# Patient Record
Sex: Female | Born: 1950 | Race: White | Hispanic: No | Marital: Married | Smoking: Never smoker
Health system: Southern US, Community
[De-identification: ages and names within clinical notes are randomized; demographics above are authoritative.]

## PROBLEM LIST (undated history)

## (undated) DIAGNOSIS — M810 Age-related osteoporosis without current pathological fracture: Secondary | ICD-10-CM

## (undated) DIAGNOSIS — R011 Cardiac murmur, unspecified: Secondary | ICD-10-CM

## (undated) DIAGNOSIS — C50919 Malignant neoplasm of unspecified site of unspecified female breast: Secondary | ICD-10-CM

## (undated) DIAGNOSIS — M1990 Unspecified osteoarthritis, unspecified site: Secondary | ICD-10-CM

## (undated) HISTORY — DX: Unspecified osteoarthritis, unspecified site: M19.90

## (undated) HISTORY — DX: Age-related osteoporosis without current pathological fracture: M81.0

## (undated) HISTORY — PX: BREAST RECONSTRUCTION: SHX9

## (undated) HISTORY — DX: Cardiac murmur, unspecified: R01.1

---

## 1987-10-06 DIAGNOSIS — C50919 Malignant neoplasm of unspecified site of unspecified female breast: Secondary | ICD-10-CM

## 1987-10-06 HISTORY — DX: Malignant neoplasm of unspecified site of unspecified female breast: C50.919

## 1987-10-06 HISTORY — PX: MASTECTOMY MODIFIED RADICAL: SUR848

## 1998-12-31 ENCOUNTER — Encounter: Payer: Self-pay | Admitting: Family Medicine

## 1998-12-31 ENCOUNTER — Ambulatory Visit (HOSPITAL_COMMUNITY): Admission: RE | Admit: 1998-12-31 | Discharge: 1998-12-31 | Payer: Self-pay | Admitting: Family Medicine

## 1999-01-21 ENCOUNTER — Encounter: Payer: Self-pay | Admitting: Plastic Surgery

## 1999-01-23 ENCOUNTER — Inpatient Hospital Stay (HOSPITAL_COMMUNITY): Admission: RE | Admit: 1999-01-23 | Discharge: 1999-01-26 | Payer: Self-pay | Admitting: Plastic Surgery

## 2003-02-27 ENCOUNTER — Other Ambulatory Visit: Admission: RE | Admit: 2003-02-27 | Discharge: 2003-02-27 | Payer: Self-pay | Admitting: Radiology

## 2007-06-13 ENCOUNTER — Other Ambulatory Visit: Admission: RE | Admit: 2007-06-13 | Discharge: 2007-06-13 | Payer: Self-pay | Admitting: Family Medicine

## 2009-07-03 ENCOUNTER — Encounter: Admission: RE | Admit: 2009-07-03 | Discharge: 2009-07-03 | Payer: Self-pay | Admitting: Radiology

## 2011-01-19 ENCOUNTER — Other Ambulatory Visit: Payer: Self-pay | Admitting: Family Medicine

## 2011-01-19 ENCOUNTER — Other Ambulatory Visit (HOSPITAL_COMMUNITY)
Admission: RE | Admit: 2011-01-19 | Discharge: 2011-01-19 | Disposition: A | Discharge status: Home / Self Care | Payer: PRIVATE HEALTH INSURANCE | Source: Ambulatory Visit | Attending: Family Medicine | Admitting: Family Medicine

## 2011-01-19 DIAGNOSIS — Z01419 Encounter for gynecological examination (general) (routine) without abnormal findings: Secondary | ICD-10-CM | POA: Insufficient documentation

## 2012-02-13 ENCOUNTER — Encounter (HOSPITAL_COMMUNITY): Payer: Self-pay | Admitting: *Deleted

## 2012-02-13 ENCOUNTER — Emergency Department (HOSPITAL_COMMUNITY)
Admission: EM | Admit: 2012-02-13 | Discharge: 2012-02-13 | Disposition: A | Discharge status: Home / Self Care | Payer: Managed Care, Other (non HMO) | Attending: Emergency Medicine | Admitting: Emergency Medicine

## 2012-02-13 ENCOUNTER — Emergency Department (HOSPITAL_COMMUNITY): Payer: Managed Care, Other (non HMO)

## 2012-02-13 DIAGNOSIS — S52502A Unspecified fracture of the lower end of left radius, initial encounter for closed fracture: Secondary | ICD-10-CM

## 2012-02-13 DIAGNOSIS — M25539 Pain in unspecified wrist: Secondary | ICD-10-CM | POA: Insufficient documentation

## 2012-02-13 DIAGNOSIS — M7989 Other specified soft tissue disorders: Secondary | ICD-10-CM | POA: Insufficient documentation

## 2012-02-13 DIAGNOSIS — W19XXXA Unspecified fall, initial encounter: Secondary | ICD-10-CM | POA: Insufficient documentation

## 2012-02-13 DIAGNOSIS — S52539A Colles' fracture of unspecified radius, initial encounter for closed fracture: Secondary | ICD-10-CM | POA: Insufficient documentation

## 2012-02-13 HISTORY — DX: Malignant neoplasm of unspecified site of unspecified female breast: C50.919

## 2012-02-13 MED ORDER — NAPROXEN 500 MG PO TABS: 500.0000 mg | ORAL_TABLET | Freq: Two times a day (BID) | ORAL | Status: AC

## 2012-02-13 MED ORDER — HYDROCODONE-ACETAMINOPHEN 5-500 MG PO TABS: 1.0000 | ORAL_TABLET | Freq: Four times a day (QID) | ORAL | Status: AC | PRN

## 2012-02-13 NOTE — ED Provider Notes (Signed)
History     CSN: [id number]  Arrival date & time 02/13/12  1901   First MD Initiated Contact with Patient 02/13/12 1908      No chief complaint on file.   (Consider location/radiation/quality/duration/timing/severity/associated sxs/prior treatment) Patient is a 61 y.o. female presenting with wrist pain. The history is provided by the patient.  Wrist Pain This is a new problem. The current episode started today. Associated symptoms include arthralgias and joint swelling. Pertinent negatives include no chills, fever, numbness or weakness.  Pt states she was walking and fell, putting her left hand down. States pain and swelling in left wrist. She went to her PCP, was sent here because their x-ray machine was not functioning. Suspected fracture was seen in distal radius. Pt denies weakness or numbness to the hand. No other injuries reported. No breaks through the skin  No past medical history on file.  No past surgical history on file.  No family history on file.  History  Substance Use Topics  . Smoking status: Not on file  . Smokeless tobacco: Not on file  . Alcohol Use: Not on file    OB History    No data available      Review of Systems  Constitutional: Negative for fever and chills.  Respiratory: Negative.   Musculoskeletal: Positive for joint swelling and arthralgias.  Skin: Negative.   Neurological: Negative for weakness and numbness.    Allergies  Codeine  Home Medications   Current Outpatient Rx  Name Route Sig Dispense Refill  . ASPIRIN EC 81 MG PO TBEC Oral Take 81 mg by mouth daily.    . ADULT MULTIVITAMIN W/MINERALS CH Oral Take 1 tablet by mouth daily.      There were no vitals taken for this visit.  Physical Exam  Nursing note and vitals reviewed. Constitutional: She is oriented to person, place, and time. She appears well-developed and well-nourished. No distress.  HENT:  Head: Normocephalic.  Eyes: Conjunctivae are normal.  Neck: Neck  supple.  Cardiovascular: Normal rate, regular rhythm and normal heart sounds.   Pulmonary/Chest: Effort normal and breath sounds normal. No respiratory distress. She has no wheezes.  Musculoskeletal: She exhibits edema and tenderness.       Swelling noted over left wrist. Tender over radial aspect of the wrist and distal radius. Pain with any wrist ROM. Normal hand and elbow exam.  Neurological: She is alert and oriented to person, place, and time.  Skin: Skin is warm and dry.  Psychiatric: She has a normal mood and affect.    ED Course  Procedures (including critical care time)  Labs Reviewed - No data to display Dg Wrist Complete Left  02/13/2012  *RADIOLOGY REPORT*  Clinical Data: Status post fall.  Wrist pain.  LEFT WRIST - COMPLETE 3+ VIEW  Comparison: None.  Findings: There is soft tissue swelling anterior to the distal radius.  There appears to be a nondisplaced fracture of the distal radius.  No other evidence of fracture is identified.  Degenerative change at the base of the thumb is noted.  IMPRESSION: Findings worrisome for nondisplaced distal radius fracture.  Original Report Authenticated By: Bernadene Bell. Maricela Curet, M.D.   Wrist placed in a splint. Will d/c home with pain medications and referral to a hand specialist.   1. Distal radius fracture, left       MDM          Lottie Mussel, PA 02/14/12 0132

## 2012-02-13 NOTE — ED Notes (Signed)
Pt missed a step, fell, catching self on L arm. Pt seen at [facility] dx w/ fracture, sent here for splinting.

## 2012-02-13 NOTE — Discharge Instructions (Signed)
Keep your splint on, avoid using left hand. Keep it elevated. Ice. Naprosyn for pain and inflammation. Take vicodin as prescribed as needed for severe pain. Follow up with Dr. Merlyn Lot in the office for recheck next week. Call to get an appointment.     Wrist Fracture Your caregiver has diagnosed you as having a fracture of the wrist. A fracture is a break in the bone or bones. A cast or splint is used to protect and keep your injured bone(s) from moving. The cast or splint will usually be on for about 5 to 6 weeks. One of the bones of the wrist (the navicular bone) often does not show up as a fracture on X-ray until later or in the healing phase. With this bone your caregiver will often cast as though it is fractured even if not seen on the X-ray. HOME CARE INSTRUCTIONS   To lessen the swelling, keep the injured part elevated while sitting or lying down. Keeping the injury above the level of your heart (the center of the chest) will decrease swelling and pain.   Do not wear rings or jewelry on the injured hand or wrist.   Apply ice to the injury for 15 to 20 minutes, 3 to 4 times per day while awake for 2 days. Put the ice in a plastic bag and place a thin towel between the bag of ice and your cast.   If you have a plaster or fiberglass cast:   Do not try to scratch the skin under the cast using sharp or pointed objects.   Check the skin around the cast every day. You may put lotion on any red or sore areas.   Keep your cast dry and clean.   If you have a plaster splint:   Wear the splint as directed.   You may loosen the elastic around the splint if your fingers become numb, tingle, or turn cold or blue.   If you have been put in a removable splint, wear and use as directed.   Do not use powders or deodorants in or around the cast or splint.   Do not remove padding from your cast or splint.   Do not put pressure on any part of your cast or splint. It may break. Rest your cast or  splint only on a pillow the first 24 hours until it is fully hardened.   Gently move your fingers often, so they do not get stiff.   Do not remove the splint unless directed by your caregiver. Casts must be removed by an orthopedist.   Your cast or splint can be protected during bathing with a plastic bag. Do not lower the cast or splint into water.   Only take over-the-counter or prescription medicines for pain, discomfort, or fever as directed by your caregiver.   Follow up with your caregiver as directed.  SEEK IMMEDIATE MEDICAL CARE IF:   Your cast or splint gets damaged or breaks.   Your cast or splint feels too tight or loose.   You have increased pain, not controlled with medication.   You have increased swelling.   Your skin or nails below the injury turn blue or grey or feel cold or numb.   You have trouble moving or feeling your fingers.   You experience any burning or stinging from the cast or splint.   There is a bad smell coming from under the cast or splint.   New stains or fluids are coming  from under the cast or splint.   You have any new injuries while wearing the cast or splint.  Document Released: 07/01/2005 Document Revised: 09/10/2011 Document Reviewed: 04/20/2007 [facility] Patient Information 2012 [place], [place].

## 2012-02-14 NOTE — ED Provider Notes (Signed)
Medical screening examination/treatment/procedure(s) were performed by non-physician practitioner and as supervising physician I was immediately available for consultation/collaboration. Marabelle Cushman, MD, FACEP   Penelope Fittro L Kaetlin Bullen, MD 02/14/12 1042 

## 2014-12-10 ENCOUNTER — Other Ambulatory Visit (INDEPENDENT_AMBULATORY_CARE_PROVIDER_SITE_OTHER): Payer: Self-pay | Admitting: Otolaryngology

## 2014-12-10 DIAGNOSIS — R221 Localized swelling, mass and lump, neck: Secondary | ICD-10-CM

## 2014-12-13 ENCOUNTER — Other Ambulatory Visit (HOSPITAL_COMMUNITY): Payer: Self-pay | Admitting: Diagnostic Radiology

## 2014-12-13 LAB — CREATININE, SERUM: CREATININE: 0.74 mg/dL (ref 0.50–1.10)

## 2014-12-13 LAB — BUN: BUN: 22 mg/dL (ref 6–23)

## 2014-12-14 ENCOUNTER — Other Ambulatory Visit: Payer: PRIVATE HEALTH INSURANCE

## 2014-12-14 ENCOUNTER — Ambulatory Visit
Admission: RE | Admit: 2014-12-14 | Discharge: 2014-12-14 | Disposition: A | Discharge status: Home / Self Care | Payer: PRIVATE HEALTH INSURANCE | Source: Ambulatory Visit | Attending: Otolaryngology | Admitting: Otolaryngology

## 2014-12-14 DIAGNOSIS — R221 Localized swelling, mass and lump, neck: Secondary | ICD-10-CM

## 2014-12-14 MED ORDER — IOPAMIDOL (ISOVUE-300) INJECTION 61%
75.0000 mL | Freq: Once | INTRAVENOUS | Status: AC | PRN
  Administered 2014-12-14: 75 mL via INTRAVENOUS

## 2015-10-01 DIAGNOSIS — Z853 Personal history of malignant neoplasm of breast: Secondary | ICD-10-CM | POA: Insufficient documentation

## 2015-10-01 DIAGNOSIS — M19042 Primary osteoarthritis, left hand: Secondary | ICD-10-CM

## 2015-10-01 DIAGNOSIS — M19041 Primary osteoarthritis, right hand: Secondary | ICD-10-CM | POA: Insufficient documentation

## 2015-10-16 DIAGNOSIS — Z23 Encounter for immunization: Secondary | ICD-10-CM | POA: Diagnosis not present

## 2015-10-22 DIAGNOSIS — M25761 Osteophyte, right knee: Secondary | ICD-10-CM | POA: Diagnosis not present

## 2015-11-06 DIAGNOSIS — Z1212 Encounter for screening for malignant neoplasm of rectum: Secondary | ICD-10-CM | POA: Diagnosis not present

## 2015-11-06 DIAGNOSIS — Z1211 Encounter for screening for malignant neoplasm of colon: Secondary | ICD-10-CM | POA: Diagnosis not present

## 2015-11-26 DIAGNOSIS — Z1211 Encounter for screening for malignant neoplasm of colon: Secondary | ICD-10-CM | POA: Insufficient documentation

## 2015-12-18 DIAGNOSIS — R6884 Jaw pain: Secondary | ICD-10-CM | POA: Diagnosis not present

## 2015-12-18 DIAGNOSIS — Z1211 Encounter for screening for malignant neoplasm of colon: Secondary | ICD-10-CM | POA: Diagnosis not present

## 2015-12-18 DIAGNOSIS — Z853 Personal history of malignant neoplasm of breast: Secondary | ICD-10-CM | POA: Diagnosis not present

## 2015-12-18 DIAGNOSIS — M19041 Primary osteoarthritis, right hand: Secondary | ICD-10-CM | POA: Diagnosis not present

## 2015-12-18 DIAGNOSIS — Z136 Encounter for screening for cardiovascular disorders: Secondary | ICD-10-CM | POA: Diagnosis not present

## 2015-12-18 DIAGNOSIS — M7051 Other bursitis of knee, right knee: Secondary | ICD-10-CM | POA: Diagnosis not present

## 2015-12-18 DIAGNOSIS — M19042 Primary osteoarthritis, left hand: Secondary | ICD-10-CM | POA: Diagnosis not present

## 2016-04-06 DIAGNOSIS — Z1231 Encounter for screening mammogram for malignant neoplasm of breast: Secondary | ICD-10-CM | POA: Diagnosis not present

## 2016-04-06 DIAGNOSIS — M810 Age-related osteoporosis without current pathological fracture: Secondary | ICD-10-CM | POA: Diagnosis not present

## 2016-04-06 DIAGNOSIS — Z853 Personal history of malignant neoplasm of breast: Secondary | ICD-10-CM | POA: Diagnosis not present

## 2016-04-20 LAB — HM DEXA SCAN

## 2016-04-22 DIAGNOSIS — M810 Age-related osteoporosis without current pathological fracture: Secondary | ICD-10-CM | POA: Insufficient documentation

## 2016-07-02 ENCOUNTER — Other Ambulatory Visit (HOSPITAL_COMMUNITY): Payer: Self-pay | Admitting: Orthopedic Surgery

## 2016-07-02 ENCOUNTER — Ambulatory Visit (HOSPITAL_COMMUNITY)
Admission: RE | Admit: 2016-07-02 | Discharge: 2016-07-02 | Disposition: A | Discharge status: Home / Self Care | Payer: Medicare Other | Source: Ambulatory Visit | Attending: Orthopedic Surgery | Admitting: Orthopedic Surgery

## 2016-07-02 DIAGNOSIS — M7989 Other specified soft tissue disorders: Secondary | ICD-10-CM | POA: Diagnosis not present

## 2016-07-02 DIAGNOSIS — M79662 Pain in left lower leg: Secondary | ICD-10-CM | POA: Insufficient documentation

## 2016-07-02 DIAGNOSIS — M79605 Pain in left leg: Secondary | ICD-10-CM

## 2016-07-02 DIAGNOSIS — M25562 Pain in left knee: Secondary | ICD-10-CM | POA: Diagnosis not present

## 2016-07-02 DIAGNOSIS — M1712 Unilateral primary osteoarthritis, left knee: Secondary | ICD-10-CM | POA: Diagnosis not present

## 2016-07-02 NOTE — Progress Notes (Signed)
*  PRELIMINARY RESULTS* Vascular Ultrasound Left lower extremity venous duplex has been completed.  Preliminary findings: No evidence of DVT or baker's cyst.   Called results to [facility].     Landry Mellow, RDMS, RVT   07/02/2016, 2:09 PM

## 2016-09-02 ENCOUNTER — Encounter (HOSPITAL_COMMUNITY): Payer: Self-pay | Admitting: Emergency Medicine

## 2016-09-02 ENCOUNTER — Emergency Department (HOSPITAL_COMMUNITY)
Admission: EM | Admit: 2016-09-02 | Discharge: 2016-09-02 | Disposition: A | Discharge status: Home / Self Care | Payer: Medicare Other | Attending: Emergency Medicine | Admitting: Emergency Medicine

## 2016-09-02 DIAGNOSIS — R040 Epistaxis: Secondary | ICD-10-CM | POA: Diagnosis not present

## 2016-09-02 DIAGNOSIS — Z853 Personal history of malignant neoplasm of breast: Secondary | ICD-10-CM | POA: Insufficient documentation

## 2016-09-02 DIAGNOSIS — Z7982 Long term (current) use of aspirin: Secondary | ICD-10-CM | POA: Diagnosis not present

## 2016-09-02 MED ORDER — OXYMETAZOLINE HCL 0.05 % NA SOLN
1.0000 | Freq: Once | NASAL | Status: AC
  Administered 2016-09-02: 1 via NASAL
  Filled 2016-09-02: qty 15

## 2016-09-02 NOTE — ED Notes (Signed)
Nose bleeding this is the 4th time this week. Has now stopped

## 2016-09-02 NOTE — ED Triage Notes (Addendum)
Patient reports daily nose bleeds x3 days. Patient reports 2 episodes today. Denies blood thinners and dizziness. Ambulatory to triage.

## 2016-09-05 NOTE — ED Provider Notes (Signed)
[facility] DEPT Provider Note   CSN: [id number] Arrival date & time: 09/02/16  1434     History   Chief Complaint Chief Complaint  Patient presents with  . Epistaxis    HPI Susan Beard is a 65 y.o. female.  HPI Patient presents to the emergency department reporting her fourth nosebleed this week.  She never previously had nosebleeds.  She's not on anticoagulants.  She feels like the blood is coming from the left neck.  No recent injury or trauma.  No recent illness.  She did recently move to a house with gas heat.  She was able to stop the bleeding with direct pressure   Past Medical History:  Diagnosis Date  . Breast cancer ([place])     There are no active problems to display for this patient.   Past Surgical History:  Procedure Laterality Date  . MASTECTOMY MODIFIED RADICAL      OB History    No data available       Home Medications    Prior to Admission medications   Medication Sig Start Date End Date Taking? Authorizing Provider  aspirin EC 81 MG tablet Take 81 mg by mouth daily.   Yes Historical Provider, MD  diphenhydrAMINE (BENADRYL) 50 MG tablet Take 50 mg by mouth at bedtime as needed for itching.   Yes Historical Provider, MD  Multiple Vitamin (MULITIVITAMIN WITH MINERALS) TABS Take 1 tablet by mouth daily.   Yes Historical Provider, MD    Family History History reviewed. No pertinent family history.  Social History Social History  Substance Use Topics  . Smoking status: Never Smoker  . Smokeless tobacco: Never Used  . Alcohol use No     Allergies   Codeine   Review of Systems Review of Systems  All other systems reviewed and are negative.    Physical Exam Updated Vital Signs BP 154/94 (BP Location: Left Arm)   Pulse 98   Temp 97.7 F (36.5 C)   Resp 18   Ht 5\' 9"  (1.753 m)   Wt 200 lb (90.7 kg)   SpO2 99%   BMI 29.53 kg/m   Physical Exam  Constitutional: She is oriented to person, place, and time. She appears  well-developed and well-nourished.  HENT:  Head: Normocephalic.  Posterior pharynx is normal.  Left neck demonstrates no active bleeding but stigmata of recent bleed.  No bleeding from the right neck.  Eyes: EOM are normal.  Neck: Normal range of motion.  Pulmonary/Chest: Effort normal.  Abdominal: She exhibits no distension.  Musculoskeletal: Normal range of motion.  Neurological: She is alert and oriented to person, place, and time.  Psychiatric: She has a normal mood and affect.  Nursing note and vitals reviewed.    ED Treatments / Results  Labs (all labs ordered are listed, but only abnormal results are displayed) Labs Reviewed - No data to display  EKG  EKG Interpretation None       Radiology No results found.  Procedures Procedures (including critical care time)  Medications Ordered in ED Medications  oxymetazoline (AFRIN) 0.05 % nasal spray 1 spray (1 spray Each Nare Given 09/02/16 1457)     Initial Impression / Assessment and Plan / ED Course  I have reviewed the triage vital signs and the nursing notes.  Pertinent labs & imaging results that were available during my care of the patient were reviewed by me and considered in my medical decision making (see chart for details).  Clinical Course  At time of my evaluation the patient is a complete resolution of epistaxis.  Her bleeding is controlled at this time.  Patient was discharged home with Afrin and hand and instructions on how to treat a nosebleed in the future.  She'll need outpatient ENT follow-up.  I recommended some humidified air in the house.  She understands return the emergency department for new or worsening symptoms  Final Clinical Impressions(s) / ED Diagnoses   Final diagnoses:  Epistaxis    New Prescriptions Discharge Medication List as of 09/02/2016  4:42 PM       Jola Schmidt, MD 09/05/16 [phone]

## 2016-12-18 DIAGNOSIS — M19041 Primary osteoarthritis, right hand: Secondary | ICD-10-CM | POA: Diagnosis not present

## 2016-12-18 DIAGNOSIS — M810 Age-related osteoporosis without current pathological fracture: Secondary | ICD-10-CM | POA: Diagnosis not present

## 2016-12-18 DIAGNOSIS — M19042 Primary osteoarthritis, left hand: Secondary | ICD-10-CM | POA: Diagnosis not present

## 2016-12-18 DIAGNOSIS — E782 Mixed hyperlipidemia: Secondary | ICD-10-CM | POA: Diagnosis not present

## 2016-12-18 DIAGNOSIS — Z0000 Encounter for general adult medical examination without abnormal findings: Secondary | ICD-10-CM | POA: Diagnosis not present

## 2016-12-18 DIAGNOSIS — Z23 Encounter for immunization: Secondary | ICD-10-CM | POA: Diagnosis not present

## 2016-12-18 DIAGNOSIS — R5383 Other fatigue: Secondary | ICD-10-CM | POA: Diagnosis not present

## 2017-04-08 DIAGNOSIS — Z1231 Encounter for screening mammogram for malignant neoplasm of breast: Secondary | ICD-10-CM | POA: Diagnosis not present

## 2017-04-08 DIAGNOSIS — Z853 Personal history of malignant neoplasm of breast: Secondary | ICD-10-CM | POA: Diagnosis not present

## 2017-04-08 LAB — HM MAMMOGRAPHY

## 2017-06-04 ENCOUNTER — Telehealth: Payer: Self-pay | Admitting: Rheumatology

## 2017-06-04 NOTE — Telephone Encounter (Signed)
Patient calling [facility] back with more information. Please call patient.

## 2017-06-04 NOTE — Telephone Encounter (Signed)
Patient called on behalf of her mom. See her profile

## 2017-11-18 ENCOUNTER — Ambulatory Visit: Payer: Medicare Other | Admitting: Family Medicine

## 2017-12-29 ENCOUNTER — Ambulatory Visit: Payer: Medicare Other | Admitting: Family Medicine

## 2018-01-17 ENCOUNTER — Ambulatory Visit: Payer: Medicare Other | Admitting: Family Medicine

## 2018-01-19 ENCOUNTER — Ambulatory Visit: Payer: Medicare Other | Admitting: Family Medicine

## 2018-01-20 ENCOUNTER — Telehealth: Payer: Self-pay | Admitting: Family Medicine

## 2018-01-20 ENCOUNTER — Other Ambulatory Visit: Payer: Self-pay | Admitting: Emergency Medicine

## 2018-01-20 ENCOUNTER — Encounter: Payer: Self-pay | Admitting: Family Medicine

## 2018-01-20 ENCOUNTER — Ambulatory Visit (INDEPENDENT_AMBULATORY_CARE_PROVIDER_SITE_OTHER): Payer: Medicare Other | Admitting: Family Medicine

## 2018-01-20 ENCOUNTER — Other Ambulatory Visit: Payer: Self-pay

## 2018-01-20 VITALS — BP 138/88 | HR 66 | Temp 97.7°F | Ht 68.5 in | Wt 209.6 lb

## 2018-01-20 DIAGNOSIS — M19042 Primary osteoarthritis, left hand: Secondary | ICD-10-CM | POA: Diagnosis not present

## 2018-01-20 DIAGNOSIS — Z1159 Encounter for screening for other viral diseases: Secondary | ICD-10-CM | POA: Diagnosis not present

## 2018-01-20 DIAGNOSIS — Z853 Personal history of malignant neoplasm of breast: Secondary | ICD-10-CM

## 2018-01-20 DIAGNOSIS — R8290 Unspecified abnormal findings in urine: Secondary | ICD-10-CM

## 2018-01-20 DIAGNOSIS — M810 Age-related osteoporosis without current pathological fracture: Secondary | ICD-10-CM | POA: Diagnosis not present

## 2018-01-20 DIAGNOSIS — Z0000 Encounter for general adult medical examination without abnormal findings: Secondary | ICD-10-CM | POA: Insufficient documentation

## 2018-01-20 DIAGNOSIS — E782 Mixed hyperlipidemia: Secondary | ICD-10-CM | POA: Diagnosis not present

## 2018-01-20 DIAGNOSIS — M19041 Primary osteoarthritis, right hand: Secondary | ICD-10-CM | POA: Diagnosis not present

## 2018-01-20 LAB — URINALYSIS, ROUTINE W REFLEX MICROSCOPIC
Bilirubin Urine: NEGATIVE
Hgb urine dipstick: NEGATIVE
Ketones, ur: NEGATIVE
Leukocytes, UA: NEGATIVE
Nitrite: NEGATIVE
PH: 8 (ref 5.0–8.0)
RBC / HPF: NONE SEEN (ref 0–?)
SPECIFIC GRAVITY, URINE: 1.01 (ref 1.000–1.030)
TOTAL PROTEIN, URINE-UPE24: NEGATIVE
Urine Glucose: NEGATIVE
Urobilinogen, UA: 0.2 (ref 0.0–1.0)

## 2018-01-20 LAB — CBC WITH DIFFERENTIAL/PLATELET
BASOS ABS: 0.1 10*3/uL (ref 0.0–0.1)
Basophils Relative: 1.2 % (ref 0.0–3.0)
EOS PCT: 2.2 % (ref 0.0–5.0)
Eosinophils Absolute: 0.1 10*3/uL (ref 0.0–0.7)
HCT: 39.6 % (ref 36.0–46.0)
Hemoglobin: 13.4 g/dL (ref 12.0–15.0)
Lymphocytes Relative: 39.7 % (ref 12.0–46.0)
Lymphs Abs: 2 10*3/uL (ref 0.7–4.0)
MCHC: 33.8 g/dL (ref 30.0–36.0)
MCV: 89.2 fl (ref 78.0–100.0)
Monocytes Absolute: 0.4 10*3/uL (ref 0.1–1.0)
Monocytes Relative: 8 % (ref 3.0–12.0)
Neutro Abs: 2.4 10*3/uL (ref 1.4–7.7)
Neutrophils Relative %: 48.9 % (ref 43.0–77.0)
Platelets: 198 10*3/uL (ref 150.0–400.0)
RBC: 4.44 Mil/uL (ref 3.87–5.11)
RDW: 14.4 % (ref 11.5–15.5)
WBC: 5 10*3/uL (ref 4.0–10.5)

## 2018-01-20 LAB — LIPID PANEL
CHOL/HDL RATIO: 4
Cholesterol: 263 mg/dL — ABNORMAL HIGH (ref 0–200)
HDL: 72.8 mg/dL (ref >39.00)
LDL CALC: 163 mg/dL — AB (ref 0–99)
NONHDL: 189.96
Triglycerides: 134 mg/dL (ref 0.0–149.0)
VLDL: 26.8 mg/dL (ref 0.0–40.0)

## 2018-01-20 LAB — COMPREHENSIVE METABOLIC PANEL
ALK PHOS: 64 U/L (ref 39–117)
ALT: 40 U/L — ABNORMAL HIGH (ref 0–35)
AST: 25 U/L (ref 0–37)
Albumin: 4.3 g/dL (ref 3.5–5.2)
BUN: 16 mg/dL (ref 6–23)
CO2: 27 mEq/L (ref 19–32)
Calcium: 9.2 mg/dL (ref 8.4–10.5)
Chloride: 104 mEq/L (ref 96–112)
Creatinine, Ser: 0.64 mg/dL (ref 0.40–1.20)
GFR: 98.03 mL/min (ref >60.00)
GLUCOSE: 94 mg/dL (ref 70–99)
POTASSIUM: 4.2 meq/L (ref 3.5–5.1)
Sodium: 140 mEq/L (ref 135–145)
TOTAL PROTEIN: 7.1 g/dL (ref 6.0–8.3)
Total Bilirubin: 0.5 mg/dL (ref 0.2–1.2)

## 2018-01-20 LAB — VITAMIN D 25 HYDROXY (VIT D DEFICIENCY, FRACTURES): VITD: 33.27 ng/mL (ref 30.00–100.00)

## 2018-01-20 MED ORDER — ALENDRONATE SODIUM 70 MG PO TABS: 70.0000 mg | ORAL_TABLET | ORAL | 3 refills | Status: DC

## 2018-01-20 MED ORDER — ZOSTER VAC RECOMB ADJUVANTED 50 MCG/0.5ML IM SUSR: 0.5000 mL | Freq: Once | INTRAMUSCULAR | 0 refills | Status: AC

## 2018-01-20 MED ORDER — ALENDRONATE SODIUM 70 MG PO TABS: ORAL_TABLET | ORAL | 3 refills | Status: DC

## 2018-01-20 NOTE — Telephone Encounter (Signed)
Changed sig; this is a weekly medication

## 2018-01-20 NOTE — Telephone Encounter (Signed)
Copied from [place] [id number]. Topic: Quick Communication - See Telephone Encounter >> Jan 20, 2018  9:51 AM Boyd Kerbs wrote: CRM for notification.   [facility] dosage (ususally is once a week, not daily)  alendronate (FOSAMAX) 70 MG tablet 90 tablet 3 01/20/2018   Sig: TAKE 1TAB IN THE MORNING WITH A FULL GLASS OF WATER ON AN EMPTY STOMACH. STAY UPRIGHT FOR 30 MINUTES  Sent to pharmacy as: alendronate (FOSAMAX) 70 MG tablet        See Telephone encounter for: 01/20/18.

## 2018-01-20 NOTE — Progress Notes (Signed)
Subjective  CC:  Chief Complaint  Patient presents with  . Establish Care    Transfer from [place], wants to discuss bloodwork from life insurance policy    HPI: Susan Beard is a 67 y.o. female who presents to [place] at [facility] today to reestablish care with me. She is a former [facility] patient and is here to reestablish care with me today.  Last medicare cpe with labs 12/2016 with AWV.   She has the following concerns or needs:  Osteoporosis on fosamax x 2 years. Tolerating well. No GERD or upper abdominal pain. Takes vit d and ca. Due for repeat dexa on meds. No falls or fractures.   OA hands and knee but not too bothersome.  Had urinalysis from insurance agency: see scan. Had + LE, Leuk, RBC and WBC but wasn't a clean catch. Denies all urinary sxs. No gross hematuria.  Lipid panel with LDL 160s. Had it controlled last year but up now. Denies change in diet. Would prefer to not be on statin unless necessary.   HM: due mammo and dexa in July; rec flu shot in September. shingrix now. Pt declines AWV. She denies concerns with memory, mood or falls.   We updated and reviewed the patient's past history in detail and it is documented below.  Patient Active Problem List   Diagnosis Date Noted  . Medicare annual wellness visit, subsequent 01/20/2018    Declined AWV 2019   . Age-related osteoporosis without current pathological fracture 04/22/2016    DEXA scan July 2017: Hip T equals -2.8, spine equals -2.5; discussed osteoporosis medications and started fosamax weekly   . Colon cancer screening 11/26/2015  . History of right breast cancer 10/01/2015    Modified radical mastectomy with reconstruction   . Primary osteoarthritis of both hands 10/01/2015   Health Maintenance  Topic Date Due  . Hepatitis C Screening  [birth year]  . DEXA SCAN  04/20/2018  . MAMMOGRAM  04/28/2018  . INFLUENZA VACCINE  05/05/2018  . TETANUS/TDAP  09/30/2018  . Fecal DNA  (Cologuard)  11/09/2018  . PNA vac Low Risk Adult  Completed   Immunization History  Administered Date(s) Administered  . Pneumococcal Conjugate-13 10/16/2015  . Pneumococcal Polysaccharide-23 12/18/2016  . Tdap 09/30/2008  . Zoster 09/30/2005   Current Meds  Medication Sig  . diphenhydrAMINE (BENADRYL) 50 MG tablet Take 50 mg by mouth at bedtime as needed for itching.  . diphenhydrAMINE (SOMINEX) 25 MG tablet Take by mouth.  . Multiple Vitamin (MULITIVITAMIN WITH MINERALS) TABS Take 1 tablet by mouth daily.  . [DISCONTINUED] alendronate (FOSAMAX) 70 MG tablet TAKE 1TAB IN THE MORNING WITH A FULL GLASS OF WATER ON AN EMPTY STOMACH. STAY UPRIGHT FOR 30 MINUTES    Allergies: Patient is allergic to codeine. Past Medical History Patient  has a past medical history of Age-related osteoporosis without current pathological fracture, Arthritis, and Breast cancer ([place]) (1989). Past Surgical History Patient  has a past surgical history that includes Mastectomy modified radical (Right, 1989) and Breast reconstruction (Right). Family History: Patient family history includes Arthritis in her mother; Brain cancer in her father; Diabetes in her daughter; Healthy in her sister and son; Hyperlipidemia in her father and mother; Osteoporosis in her mother; Polymyalgia rheumatica in her mother. Social History:  Patient  reports that she has never smoked. She has never used smokeless tobacco. She reports that she does not drink alcohol or use drugs.  Review of Systems: Constitutional: negative for fever  or malaise Ophthalmic: negative for photophobia, double vision or loss of vision Cardiovascular: negative for chest pain, dyspnea on exertion, or new LE swelling Respiratory: negative for SOB or persistent cough Gastrointestinal: negative for abdominal pain, change in bowel habits or melena Genitourinary: negative for dysuria or gross hematuria Musculoskeletal: negative for new gait disturbance or  muscular weakness Integumentary: negative for new or persistent rashes Neurological: negative for TIA or stroke symptoms Psychiatric: negative for SI or delusions Allergic/Immunologic: negative for hives  Patient Care Team    Relationship Specialty Notifications Start End  Leamon Arnt, MD PCP - General Family Medicine  01/20/18     Objective  Vitals: BP 138/88   Pulse 66   Temp 97.7 F (36.5 C)   Ht 5' 8.5" (1.74 m)   Wt 209 lb 9.6 oz (95.1 kg)   BMI 31.41 kg/m  General:  Well developed, well nourished, no acute distress  Psych:  Alert and oriented,normal mood and affect HEENT:  Normocephalic, atraumatic, non-icteric sclera, PERRL, oropharynx is without mass or exudate, supple neck without adenopathy, mass or thyromegaly Cardiovascular:  RRR without gallop, rub or murmur, nondisplaced PMI Respiratory:  Good breath sounds bilaterally, CTAB with normal respiratory effort Gastrointestinal: normal bowel sounds, soft, non-tender, no noted masses. No HSM MSK: no contusions. Joints are without erythema or swelling but OA changes in DIP bilateral hands w/o redness, warmth or ttp Skin:  Warm, no rashes or suspicious lesions noted Neurologic:    Mental status is normal. Gross motor and sensory exams are normal. Normal gait  Assessment  1. Age-related osteoporosis without current pathological fracture   2. History of right breast cancer   3. Primary osteoarthritis of both hands   4. Abnormal urinalysis   5. Mixed hyperlipidemia   6. Need for hepatitis C screening test      Plan   Osteoporosis: on Fosamax. Check vit D and Calcium levels. Ordered dexa for July.   OA - otc meds as needed.   Lipids- check fasting panel today and calculate ascvd risk score. Educated on statin indications today.   Recheck urine, clean catch today. Work up if abnormal. Check culture. She is asymptomatic  Check labs today. Declines awv. shingrix RX given.  Follow up:  Return in about 1 year (around  01/21/2019) for complete physical.  Commons side effects, risks, benefits, and alternatives for medications and treatment plan prescribed today were discussed, and the patient expressed understanding of the given instructions. Patient is instructed to call or message via MyChart if he/she has any questions or concerns regarding our treatment plan. No barriers to understanding were identified. We discussed Red Flag symptoms and signs in detail. Patient expressed understanding regarding what to do in case of urgent or emergency type symptoms.   Medication list was reconciled, printed and provided to the patient in AVS. Patient instructions and summary information was reviewed with the patient as documented in the AVS. This note was prepared with assistance of Dragon voice recognition software. Occasional wrong-word or sound-a-like substitutions may have occurred due to the inherent limitations of voice recognition software  Orders Placed This Encounter  Procedures  . Urine Culture  . HM MAMMOGRAPHY  . HM DEXA SCAN  . DG Bone Density  . Urinalysis, Routine w reflex microscopic  . Lipid panel  . CBC with Differential/Platelet  . Comprehensive metabolic panel  . VITAMIN D 25 Hydroxy (Vit-D Deficiency, Fractures)  . Hepatitis C antibody   Meds ordered this encounter  Medications  .  Zoster Vaccine Adjuvanted [facility]) injection    Sig: Inject 0.5 mLs into the muscle once for 1 dose. Please give 2nd dose 2-6 months after first dose    Dispense:  2 each    Refill:  0

## 2018-01-20 NOTE — Telephone Encounter (Signed)
Sig Changed and [facility].    Doloris Hall,  LPN

## 2018-01-20 NOTE — Patient Instructions (Signed)
Please return in 12 months for your annual complete physical; please come fasting.  We will call you with information regarding your referral appointment. [facility]ogram and bone density   If you have any questions or concerns, please don't hesitate to send me a message via MyChart or call the office at [phone]. Thank you for visiting with [place] today! It's our pleasure caring for you.  Please do these things to maintain good health!   Exercise at least 30-45 minutes a day,  4-5 days a week.   Eat a low-fat diet with lots of fruits and vegetables, up to 7-9 servings per day.  Drink plenty of water daily. Try to drink 8 8oz glasses per day.  Seatbelts can save your life. Always wear your seatbelt.  Place Smoke Detectors on every level of your home and check batteries every year.  Schedule an appointment with an eye doctor for an eye exam every 1-2 years  Safe sex - use condoms to protect yourself from STDs if you could be exposed to these types of infections. Use birth control if you do not want to become pregnant and are sexually active.  Avoid heavy alcohol use. If you drink, keep it to less than 2 drinks/day and not every day.  [place].  Choose someone you trust that could speak for you if you became unable to speak for yourself.  Depression is common in our stressful world.If you're feeling down or losing interest in things you normally enjoy, please come in for a visit.  If anyone is threatening or hurting you, please get help. Physical or Emotional Violence is never OK.   Calcium Intake Recommendations You can take Caltrate Plus twice a day or get it through your diet or other OTC supplements (Viactiv, OsCal etc)  Calcium is a mineral that affects many functions in the body, including:  Blood clotting.  Blood vessel function.  Nerve impulse conduction.  Hormone secretion.  Muscle contraction.  Bone and teeth functions.  Most of your body's  calcium supply is stored in your bones and teeth. When your calcium stores are low, you may be at risk for low bone mass, bone loss, and bone fractures. Consuming enough calcium helps to grow healthy bones and teeth and to prevent breakdown over time. It is very important that you get enough calcium if you are:  A child undergoing rapid growth.  An adolescent girl.  A pre- or post-menopausal woman.  A woman whose menstrual cycle has stopped due to anorexia nervosa or regular intense exercise.  An individual with lactose intolerance or a milk allergy.  A vegetarian.  What is my plan? Try to consume the recommended amount of calcium daily based on your age. Depending on your overall health, your health care provider may recommend increased calcium intake.General daily calcium intake recommendations by age are:  Birth to 6 months: 200 mg.  Infants 7 to 12 months: 260 mg.  Children 1 to 3 years: 700 mg.  Children 4 to 8 years: 1,000 mg.  Children 9 to 13 years: 1,300 mg.  Teens 14 to 18 years: 1,300 mg.  Adults 19 to 50 years: 1,000 mg.  Adult women 51 to 70 years: 1,200 mg.  Adult men 51 to 70 years: 1,000 mg.  Adults 71 years and older: 1,200 mg.  Pregnant and breastfeeding teens: 1,300 mg.  Pregnant and breastfeeding adults: 1,000 mg.  What do I need to know about calcium intake?  In order  for the body to absorb calcium, it needs vitamin D. You can get vitamin D through (we recommend getting [phone] units of Vitamin D daily) ? Direct exposure of the skin to sunlight. ? Foods, such as egg yolks, liver, saltwater fish, and fortified milk. ? Supplements.  Consuming too much calcium may cause: ? Constipation. ? Decreased absorption of iron and zinc. ? Kidney stones.  Calcium supplements may interact with certain medicines. Check with your health care provider before starting any calcium supplements.  Try to get most of your calcium from food. What foods can I  eat? Grains  Fortified oatmeal. Fortified ready-to-eat cereals. Fortified frozen waffles. Vegetables Turnip greens. Broccoli. Fruits Fortified orange juice. Meats and Other Protein Sources Canned sardines with bones. Canned salmon with bones. Soy beans. Tofu. Baked beans. Almonds. [place] nuts. Sunflower seeds. Dairy Milk. Yogurt. Cheese. Cottage cheese. Beverages Fortified soy milk. Fortified rice milk. Sweets/Desserts Pudding. Ice Cream. Milkshakes. Blackstrap molasses. The items listed above may not be a complete list of recommended foods or beverages. Contact your dietitian for more options. What foods can affect my calcium intake? It may be more difficult for your body to use calcium or calcium may leave your body more quickly if you consume large amounts of:  Sodium.  Protein.  Caffeine.  Alcohol.  This information is not intended to replace advice given to you by your health care provider. Make sure you discuss any questions you have with your health care provider. Document Released: 05/05/2004 Document Revised: 04/10/2016 Document Reviewed: 02/27/2014 Elsevier Interactive Patient Education  2018 [employer].

## 2018-01-20 NOTE — Telephone Encounter (Signed)
Is Fosmax Daily for this Patient or once a week? Please advise.   Doloris Hall,  LPN

## 2018-01-20 NOTE — Telephone Encounter (Signed)
Please clarify SIG for pharmacy.

## 2018-01-20 NOTE — Addendum Note (Signed)
Addended bySigurd Sos on: 01/20/2018 01:27 PM   Modules accepted: Orders

## 2018-01-22 LAB — HEPATITIS C ANTIBODY
Hepatitis C Ab: NONREACTIVE
SIGNAL TO CUT-OFF: 0.01 (ref <1.00)

## 2018-01-22 LAB — URINE CULTURE
MICRO NUMBER:: 90481057
SPECIMEN QUALITY:: ADEQUATE

## 2018-01-25 ENCOUNTER — Encounter: Payer: Self-pay | Admitting: Family Medicine

## 2018-01-25 MED ORDER — SIMVASTATIN 10 MG PO TABS: 10.0000 mg | ORAL_TABLET | Freq: Every day | ORAL | 3 refills | Status: DC

## 2018-02-08 ENCOUNTER — Other Ambulatory Visit: Payer: Self-pay

## 2018-02-08 ENCOUNTER — Telehealth: Payer: Self-pay

## 2018-02-08 DIAGNOSIS — R7989 Other specified abnormal findings of blood chemistry: Secondary | ICD-10-CM

## 2018-02-08 DIAGNOSIS — R945 Abnormal results of liver function studies: Principal | ICD-10-CM

## 2018-02-08 NOTE — Telephone Encounter (Signed)
Called patient and left a detailed voice message to see if the patient had started the Simvastatin and how she is tolerating this medication.  Dr. Jonni Sanger would like her to schedule a follow up for the medication and elevated LFTs in 3 months.   Labs have been ordered. Need to be fasting per Dr. Gerri Spore message.  CRM Created,

## 2018-03-22 ENCOUNTER — Encounter: Payer: Self-pay | Admitting: Emergency Medicine

## 2018-04-21 ENCOUNTER — Encounter: Payer: Self-pay | Admitting: Emergency Medicine

## 2018-04-21 DIAGNOSIS — Z1231 Encounter for screening mammogram for malignant neoplasm of breast: Secondary | ICD-10-CM | POA: Diagnosis not present

## 2018-04-21 DIAGNOSIS — M810 Age-related osteoporosis without current pathological fracture: Secondary | ICD-10-CM | POA: Diagnosis not present

## 2018-04-21 DIAGNOSIS — Z853 Personal history of malignant neoplasm of breast: Secondary | ICD-10-CM | POA: Diagnosis not present

## 2018-04-21 DIAGNOSIS — M8589 Other specified disorders of bone density and structure, multiple sites: Secondary | ICD-10-CM | POA: Diagnosis not present

## 2018-04-21 LAB — HM MAMMOGRAPHY

## 2018-04-21 LAB — HM DEXA SCAN

## 2018-04-22 ENCOUNTER — Encounter: Payer: Self-pay | Admitting: Emergency Medicine

## 2018-04-25 ENCOUNTER — Other Ambulatory Visit: Payer: Self-pay

## 2018-04-25 ENCOUNTER — Ambulatory Visit (INDEPENDENT_AMBULATORY_CARE_PROVIDER_SITE_OTHER): Payer: Medicare Other | Admitting: Family Medicine

## 2018-04-25 ENCOUNTER — Encounter: Payer: Self-pay | Admitting: Family Medicine

## 2018-04-25 VITALS — BP 130/78 | HR 88 | Temp 98.8°F | Ht 68.5 in | Wt 211.4 lb

## 2018-04-25 DIAGNOSIS — M810 Age-related osteoporosis without current pathological fracture: Secondary | ICD-10-CM

## 2018-04-25 DIAGNOSIS — E782 Mixed hyperlipidemia: Secondary | ICD-10-CM | POA: Diagnosis not present

## 2018-04-25 DIAGNOSIS — R945 Abnormal results of liver function studies: Secondary | ICD-10-CM | POA: Diagnosis not present

## 2018-04-25 DIAGNOSIS — M25562 Pain in left knee: Secondary | ICD-10-CM

## 2018-04-25 DIAGNOSIS — R7989 Other specified abnormal findings of blood chemistry: Secondary | ICD-10-CM

## 2018-04-25 LAB — HEPATIC FUNCTION PANEL
ALT: 24 U/L (ref 0–35)
AST: 16 U/L (ref 0–37)
Albumin: 4.2 g/dL (ref 3.5–5.2)
Alkaline Phosphatase: 58 U/L (ref 39–117)
BILIRUBIN TOTAL: 0.4 mg/dL (ref 0.2–1.2)
Bilirubin, Direct: 0.1 mg/dL (ref 0.0–0.3)
Total Protein: 7.1 g/dL (ref 6.0–8.3)

## 2018-04-25 LAB — LIPID PANEL
Cholesterol: 196 mg/dL (ref 0–200)
HDL: 67.8 mg/dL (ref >39.00)
LDL Cholesterol: 96 mg/dL (ref 0–99)
NONHDL: 128.31
Total CHOL/HDL Ratio: 3
Triglycerides: 162 mg/dL — ABNORMAL HIGH (ref 0.0–149.0)
VLDL: 32.4 mg/dL (ref 0.0–40.0)

## 2018-04-25 LAB — COMPREHENSIVE METABOLIC PANEL
ALK PHOS: 58 U/L (ref 39–117)
ALT: 24 U/L (ref 0–35)
AST: 16 U/L (ref 0–37)
Albumin: 4.2 g/dL (ref 3.5–5.2)
BUN: 15 mg/dL (ref 6–23)
CO2: 25 mEq/L (ref 19–32)
CREATININE: 0.74 mg/dL (ref 0.40–1.20)
Calcium: 9 mg/dL (ref 8.4–10.5)
Chloride: 106 mEq/L (ref 96–112)
GFR: 82.85 mL/min (ref >60.00)
GLUCOSE: 111 mg/dL — AB (ref 70–99)
Potassium: 3.8 mEq/L (ref 3.5–5.1)
SODIUM: 140 meq/L (ref 135–145)
TOTAL PROTEIN: 7.1 g/dL (ref 6.0–8.3)
Total Bilirubin: 0.4 mg/dL (ref 0.2–1.2)

## 2018-04-25 MED ORDER — DICLOFENAC SODIUM 75 MG PO TBEC: 75.0000 mg | DELAYED_RELEASE_TABLET | Freq: Two times a day (BID) | ORAL | 0 refills | Status: DC

## 2018-04-25 NOTE — Progress Notes (Signed)
Subjective  CC:  Chief Complaint  Patient presents with  . Hyperlipidemia    started simvastain on 02/08/2018  . Osteoporosis    Follow-up on bone density test, on Fosamax x2 years  . Knee Pain    Left, 4 to 6 weeks, intermittent, no injury    HPI: Susan Beard is a 67 y.o. female who presents to the office today to address the problems listed above in the chief complaint.  Follow-up hyperlipidemia: Start simvastatin 10 in April.  Tolerating well.  No myalgias.  Eating well.  Here fasting for recheck.  Had mildly elevated LFT on last check.  She had bone density scan this month.  Remains osteoporotic left femur although it is mildly improved with a T score of -2.6.  Other measures remained stable.  Tolerating Fosamax.  Normal calcium and vitamin D levels.  Complains of intermittent left knee pain with some swelling.  Had right knee pain several years back and diagnosed with mild osteoarthritis.  That resolved.  Now left knee pain with intermittent pain with walking, aching at night, clicking with walking.  No locking or giveaway.  Also dealing with left sciatica managed by chiropractor.  Denies groin pain.  Assessment  1. Mixed hyperlipidemia   2. Acute pain of left knee   3. Age-related osteoporosis without current pathological fracture   4. Elevated LFTs      Plan   Hyperlipidemia: Recheck on statin.  Refill needed.  Knee pain: Most consistent with osteoarthritis but could be meniscal injury as well.  Start ice, anti-inflammatory and knee brace as tolerated.  Follow-up if not improving for steroid injection and x-rays.  Discussed bone density scan and mildly improved osteoporosis.  Continue Fosamax for next 2 years, recheck.  Then consider drug holiday continue calcium and vitamin D  Recheck liver test.  On statin.  Follow up: Return in about 9 months (around 01/25/2019) for complete physical.   Orders Placed This Encounter  Procedures  . Comprehensive metabolic panel   . Lipid panel   Meds ordered this encounter  Medications  . diclofenac (VOLTAREN) 75 MG EC tablet    Sig: Take 1 tablet (75 mg total) by mouth 2 (two) times daily.    Dispense:  30 tablet    Refill:  0      I reviewed the patients updated PMH, FH, and SocHx.    Patient Active Problem List   Diagnosis Date Noted  . Mixed hyperlipidemia 04/25/2018  . Medicare annual wellness visit, subsequent 01/20/2018  . Age-related osteoporosis without current pathological fracture 04/22/2016  . Colon cancer screening 11/26/2015  . History of right breast cancer 10/01/2015  . Primary osteoarthritis of both hands 10/01/2015   Current Meds  Medication Sig  . alendronate (FOSAMAX) 70 MG tablet Take 1 tablet (70 mg total) by mouth once a week.  . diphenhydrAMINE (BENADRYL) 50 MG tablet Take 50 mg by mouth at bedtime as needed for itching.  . Multiple Vitamin (MULITIVITAMIN WITH MINERALS) TABS Take 1 tablet by mouth daily.  . simvastatin (ZOCOR) 10 MG tablet Take 1 tablet (10 mg total) by mouth at bedtime.  . [DISCONTINUED] diphenhydrAMINE (SOMINEX) 25 MG tablet Take by mouth.    Allergies: Patient is allergic to codeine. Family History: Patient family history includes Arthritis in her mother; Brain cancer in her father; Diabetes in her daughter; Healthy in her sister and son; Hyperlipidemia in her father and mother; Osteoporosis in her mother; Polymyalgia rheumatica in her mother. Social History:  Patient  reports that she has never smoked. She has never used smokeless tobacco. She reports that she does not drink alcohol or use drugs.  Review of Systems: Constitutional: Negative for fever malaise or anorexia Cardiovascular: negative for chest pain Respiratory: negative for SOB or persistent cough Gastrointestinal: negative for abdominal pain  Objective  Vitals: BP 130/78   Pulse 88   Temp 98.8 F (37.1 C)   Ht 5' 8.5" (1.74 m)   Wt 211 lb 6.4 oz (95.9 kg)   SpO2 98%   BMI 31.68  kg/m  General: no acute distress , A&Ox3 Left knee: Mild swelling, mild warmth, medial joint line tenderness negative Lachman negative McMurray's normal range of motion without crepitus    Commons side effects, risks, benefits, and alternatives for medications and treatment plan prescribed today were discussed, and the patient expressed understanding of the given instructions. Patient is instructed to call or message via MyChart if he/she has any questions or concerns regarding our treatment plan. No barriers to understanding were identified. We discussed Red Flag symptoms and signs in detail. Patient expressed understanding regarding what to do in case of urgent or emergency type symptoms.   Medication list was reconciled, printed and provided to the patient in AVS. Patient instructions and summary information was reviewed with the patient as documented in the AVS. This note was prepared with assistance of Dragon voice recognition software. Occasional wrong-word or sound-a-like substitutions may have occurred due to the inherent limitations of voice recognition software

## 2018-04-25 NOTE — Patient Instructions (Addendum)
Please return in April 2020 for your annual complete physical; please come fasting.  Return if your knee pain persists for further evaluation.  Start icing the knee and using the diclofenac twice a day.    If you have any questions or concerns, please don't hesitate to send me a message via MyChart or call the office at [phone]. Thank you for visiting with [place] today! It's our pleasure caring for you.   Knee Pain, Adult Knee pain in adults is common. It can be caused by many things, including:  Arthritis.  A fluid-filled sac (cyst) or growth in your knee.  An infection in your knee.  An injury that will not heal.  Damage, swelling, or irritation of the tissues that support your knee.  Knee pain is usually not a sign of a serious problem. The pain may go away on its own with time and rest. If it does not, a health care provider may order tests to find the cause of the pain. These may include:  Imaging tests, such as an X-ray, MRI, or ultrasound.  Joint aspiration. In this test, fluid is removed from the knee.  Arthroscopy. In this test, a lighted tube is inserted into knee and an image is projected onto a TV screen.  A biopsy. In this test, a sample of tissue is removed from the body and studied under a microscope.  Follow these instructions at home: Pay attention to any changes in your symptoms. Take these actions to relieve your pain. Activity  Rest your knee.  Do not do things that cause pain or make pain worse.  Avoid high-impact activities or exercises, such as running, jumping rope, or doing jumping jacks. General instructions  Take over-the-counter and prescription medicines only as told by your health care provider.  Raise (elevate) your knee above the level of your heart when you are sitting or lying down.  Sleep with a pillow under your knee.  If directed, apply ice to the knee: ? Put ice in a plastic bag. ? Place a towel between your skin and the  bag. ? Leave the ice on for 20 minutes, 2-3 times a day.  Ask your health care provider if you should wear an elastic knee support.  Lose weight if you are overweight. Extra weight can put pressure on your knee.  Do not use any products that contain nicotine or tobacco, such as cigarettes and e-cigarettes. Smoking may slow the healing of any bone and joint problems that you may have. If you need help quitting, ask your health care provider. Contact a health care provider if:  Your knee pain continues, changes, or gets worse.  You have a fever along with knee pain.  Your knee buckles or locks up.  Your knee swells, and the swelling becomes worse. Get help right away if:  Your knee feels warm to the touch.  You cannot move your knee.  You have severe pain in your knee.  You have chest pain.  You have trouble breathing. Summary  Knee pain in adults is common. It can be caused by many things, including, arthritis, infection, cysts, or injury.  Knee pain is usually not a sign of a serious problem, but if it does not go away, a health care provider may perform tests to know the cause of the pain.  Pay attention to any changes in your symptoms. Relieve your pain with rest, medicines, light activity, and use of ice.  Get help if your pain continues  or becomes very severe, or if your knee buckles or locks up, or if you have chest pain or trouble breathing. This information is not intended to replace advice given to you by your health care provider. Make sure you discuss any questions you have with your health care provider. Document Released: 07/19/2007 Document Revised: 09/11/2016 Document Reviewed: 09/11/2016 Elsevier Interactive Patient Education  [employer].

## 2018-06-14 ENCOUNTER — Ambulatory Visit: Payer: Self-pay

## 2018-06-14 ENCOUNTER — Encounter: Payer: Self-pay | Admitting: Sports Medicine

## 2018-06-14 ENCOUNTER — Ambulatory Visit (INDEPENDENT_AMBULATORY_CARE_PROVIDER_SITE_OTHER): Payer: Medicare Other | Admitting: Sports Medicine

## 2018-06-14 VITALS — BP 140/80 | HR 80 | Ht 68.5 in | Wt 211.4 lb

## 2018-06-14 DIAGNOSIS — M23204 Derangement of unspecified medial meniscus due to old tear or injury, left knee: Secondary | ICD-10-CM | POA: Diagnosis not present

## 2018-06-14 DIAGNOSIS — G8929 Other chronic pain: Secondary | ICD-10-CM | POA: Diagnosis not present

## 2018-06-14 DIAGNOSIS — M1712 Unilateral primary osteoarthritis, left knee: Secondary | ICD-10-CM

## 2018-06-14 DIAGNOSIS — M25562 Pain in left knee: Secondary | ICD-10-CM | POA: Diagnosis not present

## 2018-06-14 NOTE — Progress Notes (Signed)
Susan Beard. Susan Beard, [facility] - 67 y.o. female MRN [id number]  Date of birth: April 08, 1951  Visit Date: 06/14/2018  PCP: Leamon Arnt, MD   Referred by: Leamon Arnt, MD  Scribe(s) for today's visit: Wendy Poet, LAT, ATC  SUBJECTIVE:  Susan Beard is here for New Patient (Initial Visit) (L knee pain) .    Her L knee pain symptoms INITIALLY: Began a few months ago w/ known MOI.  She was prescribed Voltaren by her PCP which she took for a few weeks and found no relief w/ use of this medication. Described as mild-mod swollen/tight w/ intermittent sharp pain, nonradiating Worsened with walking and w/ rotation at night while she sleeps Improved with voltaren gel previously but it is no longer helping; ice Additional associated symptoms include: L knee swelling noted and intermittent clicking in the the L knee    At this time symptoms are worsening compared to onset  She took a short course of Voltaren but is no longer taking this.  Has also tried OTC Advil.   REVIEW OF SYSTEMS: Reports night time disturbances. Denies fevers, chills, or night sweats. Denies unexplained weight loss. Reports personal history of cancer. R breast CA in 1989. Denies changes in bowel or bladder habits. Denies recent unreported falls. Denies new or worsening dyspnea or wheezing. Denies headaches or dizziness.  Denies numbness, tingling or weakness  In the extremities.  Denies dizziness or presyncopal episodes Denies lower extremity edema    HISTORY & PERTINENT PRIOR DATA:  Prior history reviewed and updated per electronic medical record.  Social History   Occupational History  . Occupation: Licensed Animator: UNKNOWN  Tobacco Use  . Smoking status: Never Smoker  . Smokeless tobacco: Never Used  Substance and Sexual Activity  . Alcohol use: No  . Drug use: No  . Sexual activity: Yes     Birth control/protection: Post-menopausal   Social History   Social History Narrative  . Not on file    Past Medical History:  Diagnosis Date  . Age-related osteoporosis without current pathological fracture    Dexa Scan July 2017  . Arthritis   . Breast cancer [facility]) 1989   right   Past Surgical History:  Procedure Laterality Date  . BREAST RECONSTRUCTION Right   . MASTECTOMY MODIFIED RADICAL Right 1989   family history includes Arthritis in her mother; Brain cancer in her father; Diabetes in her daughter; Healthy in her sister and son; Hyperlipidemia in her father and mother; Osteoporosis in her mother; Polymyalgia rheumatica in her mother.   DATA OBTAINED & REVIEWED:  . No results for input(s): HGBA1C, LABURIC, CREATINE in the last 8760 hours. .   OBJECTIVE:  VS:  HT:5' 8.5" (174 cm)   WT:211 lb 6.4 oz (95.9 kg)  BMI:31.67    BP:140/80  HR:80bpm  TEMP: ( )  RESP:94 %   PHYSICAL EXAM: CONSTITUTIONAL: Well-developed, Well-nourished and In no acute distress Psychiatric: Alert & appropriately interactive. and Not depressed or anxious appearing. RESPIRATORY: No increased work of breathing and Trachea Midline EYES: Pupils are equal., EOM intact without nystagmus. and No scleral icterus.  Lower extremities: Warm and well perfused Pulses: DP Pulses: Bilaterally normal and symmetric PT Pulses: Bilaterally normal and symmetric Edema: Joint associated swelling as per Orthopedic exam and 1+ pitting edema pretibial bilaterally NEURO: unremarkable  MSK Exam:  Left Knee  Alignment &  Contours: normal Skin: No overlying erythema/ecchymosis Effusion: not significant   Generalized Synovitis: mild Knee Tenderness: Medial joint line, Lateral joint line and No focal bony TTP Gait: normal Patellar grind produces: No pain and No crepitation   RANGE OF MOTION & STRENGTH  EXTENSION: Normal  with no pain.   Strength: Normal FLEXION: Normal with no pain.   Strength: Normal     LIGAMENTOUS TESTING  Varus & Valgus Strain: stable to testing Anterior & Posterior Drawer: stable to testing    SPECIALITY TESTING:    Mcmurray's: positive, mild pain Thessaly: positive, mild pain    ASSESSMENT   1. Chronic pain of left knee   2. Primary osteoarthritis of left knee   3. Degenerative tear of medial meniscus of left knee     PLAN & PROCEDURES:  . Please see AVS (Patient Instructions) for further patient specific directions . US Guided Injection per procedure note . Discussed the foundation of treatment for this condition is physical therapy and/or daily (5-6 days/week) therapeutic exercises, focusing on core strengthening, coordination, neuromuscular control/reeducation.  Therapeutic exercises prescribed per procedure note. Marland Kitchen Rest the injured area as much as practical . Apply ice packs . If any lack of improvement consider further diagnostic evaluation with X-rays, repeat corticosteroid injections or Visco-supplementation n/a   Follow-up: Return in about 6 weeks (around 07/26/2018).      Please see additional documentation for Objective, Assessment and Plan sections. Pertinent additional documentation may be included in corresponding procedure notes, imaging studies, problem based documentation and patient instructions. Please see these sections of the encounter for additional information regarding this visit.  CMA/ATC served as Education administrator during this visit. History, Physical, and Plan performed by medical provider. Documentation and orders reviewed and attested to.      Gerda Diss, [facility] Physician

## 2018-06-14 NOTE — Procedures (Signed)
PROCEDURE NOTE:  Ultrasound Guided: Injection: Left knee Images were obtained and interpreted by myself, Sandria Mcenroe, DO  Images have been saved and stored to PACS system. Images obtained on: GE S7 Ultrasound machine    ULTRASOUND FINDINGS:  Moderate synovitis with small effusion.  DESCRIPTION OF PROCEDURE:  The patient's clinical condition is marked by substantial pain and/or significant functional disability. Other conservative therapy has not provided relief, is contraindicated, or not appropriate. There is a reasonable likelihood that injection will significantly improve the patient's pain and/or functional impairment.   After discussing the risks, benefits and expected outcomes of the injection and all questions were reviewed and answered, the patient wished to undergo the above named procedure.  Verbal consent was obtained.  The ultrasound was used to identify the target structure and adjacent neurovascular structures. The skin was then prepped in sterile fashion and the target structure was injected under direct visualization using sterile technique as below:  Single injection performed as below: PREP: Alcohol and Ethel Chloride APPROACH:superiolateral, single injection, 21g 2 in. INJECTATE: 2 cc 0.5% Marcaine and 2 cc 40mg/mL DepoMedrol ASPIRATE: None DRESSING: Band-Aid  Post procedural instructions including recommending icing and warning signs for infection were reviewed.    This procedure was well tolerated and there were no complications.   IMPRESSION: Succesful Ultrasound Guided: Injection 

## 2018-06-14 NOTE — Patient Instructions (Addendum)

## 2018-06-14 NOTE — Progress Notes (Signed)

## 2018-06-16 ENCOUNTER — Encounter: Payer: Self-pay | Admitting: Sports Medicine

## 2018-06-16 DIAGNOSIS — Z23 Encounter for immunization: Secondary | ICD-10-CM | POA: Diagnosis not present

## 2018-07-26 ENCOUNTER — Ambulatory Visit: Payer: Medicare Other | Admitting: Sports Medicine

## 2018-07-26 DIAGNOSIS — Z0289 Encounter for other administrative examinations: Secondary | ICD-10-CM

## 2018-07-27 ENCOUNTER — Encounter: Payer: Self-pay | Admitting: Sports Medicine

## 2018-07-27 ENCOUNTER — Ambulatory Visit (INDEPENDENT_AMBULATORY_CARE_PROVIDER_SITE_OTHER): Payer: Medicare Other | Admitting: Sports Medicine

## 2018-07-27 ENCOUNTER — Ambulatory Visit (INDEPENDENT_AMBULATORY_CARE_PROVIDER_SITE_OTHER): Payer: Medicare Other

## 2018-07-27 VITALS — BP 120/80 | HR 68 | Ht 68.5 in | Wt 214.8 lb

## 2018-07-27 DIAGNOSIS — M25562 Pain in left knee: Secondary | ICD-10-CM

## 2018-07-27 DIAGNOSIS — G8929 Other chronic pain: Secondary | ICD-10-CM

## 2018-07-27 DIAGNOSIS — M1712 Unilateral primary osteoarthritis, left knee: Secondary | ICD-10-CM | POA: Diagnosis not present

## 2018-07-27 MED ORDER — DICLOFENAC SODIUM 1 % TD GEL: TRANSDERMAL | 1 refills | Status: DC

## 2018-07-27 NOTE — Patient Instructions (Addendum)
I recommend you obtained a compression sleeve to help with your joint problems. There are many options on the market however I recommend obtaining a full knee Body Helix compression sleeve.  You can find information (including how to appropriate measure yourself for sizing) can be found at www.Body http://www.lambert.com/.  Many of these products are health savings account (HSA) eligible.   You can use the compression sleeve at any time throughout the day but is most important to use while being active as well as for 2 hours post-activity.   It is appropriate to ice following activity with the compression sleeve in place.

## 2018-07-27 NOTE — Progress Notes (Signed)
Susan Beard. Susan Beard, [place] [facility] [place] - 67 y.o. female MRN [id number]  Date of birth: [birth year]  Visit Date: 07/27/2018  PCP: Leamon Arnt, MD   Referred by: Leamon Arnt, MD  Scribe(s) for today's visit: Josepha Pigg, CMA  SUBJECTIVE:  Susan Beard is here for Follow-up (L knee pain)   06/14/2018: Her L knee pain symptoms INITIALLY: Began a few months ago w/ known MOI.  She was prescribed Voltaren by her PCP which she took for a few weeks and found no relief w/ use of this medication. Described as mild-mod swollen/tight w/ intermittent sharp pain, nonradiating Worsened with walking and w/ rotation at night while she sleeps Improved with voltaren gel previously but it is no longer helping; ice Additional associated symptoms include: L knee swelling noted and intermittent clicking in the the L knee   At this time symptoms are worsening compared to onset  She took a short course of Voltaren but is no longer taking this.  Has also tried OTC Advil.  07/27/2018: Compared to the last office visit, her previously described symptoms are improving slightly. She hasn't noticed any swelling around the knee. She reports that clicking has resolved since her last visit. Sx are worse after walking for a long period of time.  Current symptoms are mild-moderate & are nonradiating She has not been taking any OTC meds for pain. She has been using ice with short term relief. She has not been doing HEP, she reports that's sx really become aggravated after attempting HEP. She has tried OTC oral NSAIDs in the past, Advil/Aleve with minimal relief. She has tried oral Diclofenac in the past with minimal relief.   No recent XR of the L knee.    REVIEW OF SYSTEMS: Reports night time disturbances. Denies fevers, chills, or night sweats. Denies unexplained weight loss. Reports personal history of cancer. R breast CA in  1989. Denies changes in bowel or bladder habits. Denies recent unreported falls. Denies new or worsening dyspnea or wheezing. Denies headaches or dizziness.  Reports numbness and tingling in R foot, no feeling in her great toe.  Denies dizziness or presyncopal episodes Denies lower extremity edema    HISTORY:  Prior history reviewed and updated per electronic medical record.  Social History   Occupational History  . Occupation: Licensed Animator: UNKNOWN  Tobacco Use  . Smoking status: Never Smoker  . Smokeless tobacco: Never Used  Substance and Sexual Activity  . Alcohol use: No  . Drug use: No  . Sexual activity: Yes    Birth control/protection: Post-menopausal   Social History   Social History Narrative  . Not on file    DATA OBTAINED & REVIEWED:   Recent Labs    01/20/18 0936 04/25/18 0838  CALCIUM 9.2 9.0  AST 25 16  16   ALT 40* 24  24   X-rays left knee 07/27/2018:: Degenerative changes most notably in the medial compartment.  OBJECTIVE:  VS:  HT:5' 8.5" (174 cm)   WT:214 lb 12.8 oz (97.4 kg)  BMI:32.18    BP:120/80  HR:68bpm  TEMP: ( )  RESP:96 %   PHYSICAL EXAM: CONSTITUTIONAL: Well-developed, Well-nourished and In no acute distress PSYCHIATRIC: Alert & appropriately interactive. and Not depressed or anxious appearing. RESPIRATORY: No increased work of breathing and Trachea Midline EYES: Pupils are equal., EOM intact without nystagmus. and No scleral icterus.  VASCULAR  EXAM: Warm and well perfused NEURO: unremarkable  MSK Exam: Left knee: Overall well aligned.  No significant effusion.  Moderate synovitis.  Ligamentously stable.  Pain with medial and lateral joint line tenderness.  Pain with McMurray's but no appreciable mechanical symptoms.  ASSESSMENT   1. Chronic pain of left knee     PLAN:  Pertinent additional documentation may be included in corresponding procedure notes, imaging studies, problem based  documentation and patient instructions.  Procedures:  . None  Medications:  Meds ordered this encounter  Medications  . diclofenac sodium (VOLTAREN) 1 % GEL    Sig: Apply topically to affected area qid    Dispense:  100 g    Refill:  1   Discussion/Instructions: No problem-specific Assessment & Plan notes found for this encounter.  Marland Kitchen Ultimately she responded well to the intra-articular injection performed 6 weeks ago but has been increasing her activity and has slightly exacerbated her underlying degenerative changes.  She should respond well to topical diclofenac and recommended a compressive sleeve for when she is active. . Can consider repeat injections and/or Visco supplementation in the future if persistent/recurrent symptoms.  Follow-up:  . Return if symptoms worsen or fail to improve.      CMA/ATC served as Education administrator during this visit. History, Physical, and Plan performed by medical provider. Documentation and orders reviewed and attested to.      Gerda Diss, [facility] Physician

## 2018-08-03 ENCOUNTER — Encounter: Payer: Self-pay | Admitting: Sports Medicine

## 2018-10-17 ENCOUNTER — Encounter: Payer: Self-pay | Admitting: Sports Medicine

## 2018-10-28 DIAGNOSIS — M722 Plantar fascial fibromatosis: Secondary | ICD-10-CM | POA: Diagnosis not present

## 2018-10-28 DIAGNOSIS — M7732 Calcaneal spur, left foot: Secondary | ICD-10-CM | POA: Diagnosis not present

## 2018-11-04 DIAGNOSIS — M722 Plantar fascial fibromatosis: Secondary | ICD-10-CM | POA: Diagnosis not present

## 2018-11-11 DIAGNOSIS — M722 Plantar fascial fibromatosis: Secondary | ICD-10-CM | POA: Diagnosis not present

## 2018-11-11 DIAGNOSIS — M71572 Other bursitis, not elsewhere classified, left ankle and foot: Secondary | ICD-10-CM | POA: Diagnosis not present

## 2018-12-16 DIAGNOSIS — M722 Plantar fascial fibromatosis: Secondary | ICD-10-CM | POA: Diagnosis not present

## 2019-01-24 ENCOUNTER — Encounter: Payer: Medicare Other | Admitting: Family Medicine

## 2019-01-26 ENCOUNTER — Other Ambulatory Visit: Payer: Self-pay | Admitting: Family Medicine

## 2019-03-02 ENCOUNTER — Other Ambulatory Visit: Payer: Self-pay | Admitting: *Deleted

## 2019-03-02 MED ORDER — ALENDRONATE SODIUM 70 MG PO TABS: 70.0000 mg | ORAL_TABLET | ORAL | 3 refills | Status: DC

## 2019-03-13 ENCOUNTER — Ambulatory Visit (INDEPENDENT_AMBULATORY_CARE_PROVIDER_SITE_OTHER): Payer: Medicare Other | Admitting: Family Medicine

## 2019-03-13 ENCOUNTER — Encounter: Payer: Self-pay | Admitting: Family Medicine

## 2019-03-13 ENCOUNTER — Other Ambulatory Visit: Payer: Self-pay

## 2019-03-13 VITALS — BP 134/78 | HR 72 | Temp 98.2°F | Resp 16 | Ht 68.5 in | Wt 212.2 lb

## 2019-03-13 DIAGNOSIS — M170 Bilateral primary osteoarthritis of knee: Secondary | ICD-10-CM

## 2019-03-13 DIAGNOSIS — Z1211 Encounter for screening for malignant neoplasm of colon: Secondary | ICD-10-CM

## 2019-03-13 DIAGNOSIS — M810 Age-related osteoporosis without current pathological fracture: Secondary | ICD-10-CM | POA: Diagnosis not present

## 2019-03-13 DIAGNOSIS — M25562 Pain in left knee: Secondary | ICD-10-CM

## 2019-03-13 DIAGNOSIS — Z853 Personal history of malignant neoplasm of breast: Secondary | ICD-10-CM | POA: Diagnosis not present

## 2019-03-13 DIAGNOSIS — E782 Mixed hyperlipidemia: Secondary | ICD-10-CM | POA: Diagnosis not present

## 2019-03-13 DIAGNOSIS — G8929 Other chronic pain: Secondary | ICD-10-CM

## 2019-03-13 HISTORY — DX: Bilateral primary osteoarthritis of knee: M17.0

## 2019-03-13 LAB — LIPID PANEL
Cholesterol: 280 mg/dL — ABNORMAL HIGH (ref 0–200)
HDL: 74.6 mg/dL (ref >39.00)
LDL Cholesterol: 180 mg/dL — ABNORMAL HIGH (ref 0–99)
NonHDL: 205.55
Total CHOL/HDL Ratio: 4
Triglycerides: 126 mg/dL (ref 0.0–149.0)
VLDL: 25.2 mg/dL (ref 0.0–40.0)

## 2019-03-13 LAB — COMPREHENSIVE METABOLIC PANEL
ALT: 19 U/L (ref 0–35)
AST: 16 U/L (ref 0–37)
Albumin: 4.2 g/dL (ref 3.5–5.2)
Alkaline Phosphatase: 59 U/L (ref 39–117)
BUN: 16 mg/dL (ref 6–23)
CO2: 27 mEq/L (ref 19–32)
Calcium: 9.1 mg/dL (ref 8.4–10.5)
Chloride: 104 mEq/L (ref 96–112)
Creatinine, Ser: 0.62 mg/dL (ref 0.40–1.20)
GFR: 95.35 mL/min (ref >60.00)
Glucose, Bld: 92 mg/dL (ref 70–99)
Potassium: 4.1 mEq/L (ref 3.5–5.1)
Sodium: 139 mEq/L (ref 135–145)
Total Bilirubin: 0.5 mg/dL (ref 0.2–1.2)
Total Protein: 6.9 g/dL (ref 6.0–8.3)

## 2019-03-13 LAB — CBC WITH DIFFERENTIAL/PLATELET
Basophils Absolute: 0.1 10*3/uL (ref 0.0–0.1)
Basophils Relative: 1.1 % (ref 0.0–3.0)
Eosinophils Absolute: 0.1 10*3/uL (ref 0.0–0.7)
Eosinophils Relative: 1.1 % (ref 0.0–5.0)
HCT: 39.7 % (ref 36.0–46.0)
Hemoglobin: 13.3 g/dL (ref 12.0–15.0)
Lymphocytes Relative: 38 % (ref 12.0–46.0)
Lymphs Abs: 2.1 10*3/uL (ref 0.7–4.0)
MCHC: 33.4 g/dL (ref 30.0–36.0)
MCV: 90.4 fl (ref 78.0–100.0)
Monocytes Absolute: 0.4 10*3/uL (ref 0.1–1.0)
Monocytes Relative: 6.3 % (ref 3.0–12.0)
Neutro Abs: 3 10*3/uL (ref 1.4–7.7)
Neutrophils Relative %: 53.5 % (ref 43.0–77.0)
Platelets: 208 10*3/uL (ref 150.0–400.0)
RBC: 4.39 Mil/uL (ref 3.87–5.11)
RDW: 14.4 % (ref 11.5–15.5)
WBC: 5.6 10*3/uL (ref 4.0–10.5)

## 2019-03-13 LAB — TSH: TSH: 1.05 u[IU]/mL (ref 0.35–4.50)

## 2019-03-13 MED ORDER — SIMVASTATIN 10 MG PO TABS: ORAL_TABLET | ORAL | 3 refills | Status: DC

## 2019-03-13 NOTE — Progress Notes (Signed)
Subjective    Chief Complaint  Patient presents with  . Annual Exam    She is fasting  . Hyperlipidemia    She ran out of medication first week in May    HPI: Susan Beard is a 68 y.o. female who presents to [place] [facility] [place] today for a Female Wellness Visit. She also has the concerns and/or needs as listed above in the chief complaint. These will be addressed in addition to the Health Maintenance Visit.   Wellness Visit: annual visit with health maintenance review and exam without Pap   HM: due for CRC screen. Last was negative cologuard 3 years ago. mammo due in July; pt to schedule. imms up to date. Lifestyle: avg. Chronic disease f/u and/or acute problem visit: (deemed necessary to be done in addition to the wellness visit):  Osteoporosis: reviewed dexa from last year; on fosamax, ca and vit D and doing fine w/o AEs or side effects.   HLD: ran out of statin last month but had been compliant and no AEs. Would prefer not to take it but will if it is indicated. Diet is regular. Limited exercise.   Knee pain: has OA s/p steroid injection on right; saw SM for left knee pain and told "inflamed meniscus"; steroid injection did not help. Has almost daily pain w/o swelling or give way. Certain mvts cause pain.  Has had xrays; no MRI.   H/o right Br cancer: due for mammo.  The 10-year ASCVD risk score Mikey Bussing DC Brooke Bonito., et al., 2013) is: 8.8%   Values used to calculate the score:     Age: 62 years     Sex: Female     Is Non-Hispanic African American: No     Diabetic: No     Tobacco smoker: No     Systolic Blood Pressure: 500 mmHg     Is BP treated: No     HDL Cholesterol: 67.8 mg/dL     Total Cholesterol: 196 mg/dL  Assessment  1. Mixed hyperlipidemia   2. Age-related osteoporosis without current pathological fracture   3. History of right breast cancer   4. Primary osteoarthritis of both hands   5. Colon cancer screening   6. Chronic pain of left knee    7. Primary osteoarthritis of knees, bilateral      Plan  Female Wellness Visit:  Age appropriate Health Maintenance and Prevention measures were discussed with patient. Included topics are cancer screening recommendations, ways to keep healthy (see AVS) including dietary and exercise recommendations, regular eye and dental care, use of seat belts, and avoidance of moderate alcohol use and tobacco use.   BMI: discussed patient's BMI and encouraged positive lifestyle modifications to help get to or maintain a target BMI.  HM needs and immunizations were addressed and ordered. See below for orders. See HM and immunization section for updates.  Routine labs and screening tests ordered including cmp, cbc and lipids where appropriate.  Discussed recommendations regarding Vit D and calcium supplementation (see AVS)  Chronic disease management visit and/or acute problem visit:  See below for problem based assessment and plan documentation  Follow up: Return in about 1 year (around 03/12/2020) for complete physical.  Orders Placed This Encounter  Procedures  . CBC with Differential/Platelet  . Comprehensive metabolic panel  . Lipid panel  . TSH  . Cologuard  . Ambulatory referral to Sports Medicine   Meds ordered this encounter  Medications  . simvastatin (ZOCOR) 10  MG tablet    Sig: TAKE 1 TABLET BY MOUTH EVERYDAY AT BEDTIME    Dispense:  90 tablet    Refill:  3      Lifestyle: Body mass index is 31.8 kg/m. Wt Readings from Last 3 Encounters:  03/13/19 212 lb 3.2 oz (96.3 kg)  07/27/18 214 lb 12.8 oz (97.4 kg)  06/14/18 211 lb 6.4 oz (95.9 kg)    Patient Active Problem List   Diagnosis Date Noted  . Chronic pain of left knee 03/13/2019  . Primary osteoarthritis of knees, bilateral 03/13/2019  . Mixed hyperlipidemia 04/25/2018  . Medicare annual wellness visit, subsequent 01/20/2018    Declined AWV 2019   . Age-related osteoporosis without current pathological fracture  04/22/2016    DEXA j7/2019: stable osteoporosis; continue fosamax. DEXA scan July 2017: Hip T equals -2.8, spine equals -2.5; discussed osteoporosis medications and started fosamax weekly   . Colon cancer screening 11/26/2015  . History of right breast cancer 10/01/2015    Modified radical mastectomy with reconstruction   . Primary osteoarthritis of both hands 10/01/2015   Health Maintenance  Topic Date Due  . Samul Dada  09/30/2018  . Fecal DNA (Cologuard)  11/09/2018  . MAMMOGRAM  04/22/2019  . INFLUENZA VACCINE  05/06/2019  . DEXA SCAN  04/21/2020  . Hepatitis C Screening  Completed  . PNA vac Low Risk Adult  Completed   Immunization History  Administered Date(s) Administered  . Influenza, High Dose Seasonal PF 06/16/2018  . Pneumococcal Conjugate-13 10/16/2015  . Pneumococcal Polysaccharide-23 12/18/2016  . Tdap 09/30/2008  . Zoster 09/30/2005  . Zoster Recombinat (Shingrix) 06/16/2018, 11/11/2018   We updated and reviewed the patient's past history in detail and it is documented below. Allergies: Patient is allergic to codeine. Past Medical History Patient  has a past medical history of Age-related osteoporosis without current pathological fracture, Arthritis, Breast cancer ([place]) (1989), and Primary osteoarthritis of knees, bilateral (03/13/2019). Past Surgical History Patient  has a past surgical history that includes Mastectomy modified radical (Right, 1989) and Breast reconstruction (Right). Family History: Patient family history includes Arthritis in her mother; Brain cancer in her father; Diabetes in her daughter; Healthy in her sister and son; Hyperlipidemia in her father and mother; Osteoporosis in her mother; Polymyalgia rheumatica in her mother. Social History:  Patient  reports that she has never smoked. She has never used smokeless tobacco. She reports that she does not drink alcohol or use drugs.  Review of Systems: Constitutional: negative for fever or  malaise Ophthalmic: negative for photophobia, double vision or loss of vision Cardiovascular: negative for chest pain, dyspnea on exertion, or new LE swelling Respiratory: negative for SOB or persistent cough Gastrointestinal: negative for abdominal pain, change in bowel habits or melena Genitourinary: negative for dysuria or gross hematuria, no abnormal uterine bleeding or disharge Musculoskeletal: negative for new gait disturbance or muscular weakness Integumentary: negative for new or persistent rashes, no breast lumps Neurological: negative for TIA or stroke symptoms Psychiatric: negative for SI or delusions Allergic/Immunologic: negative for hives  Patient Care Team    Relationship Specialty Notifications Start End  Leamon Arnt, MD PCP - General Family Medicine  01/20/18     Objective  Vitals: BP 134/78   Pulse 72   Temp 98.2 F (36.8 C) (Oral)   Resp 16   Ht 5' 8.5" (1.74 m)   Wt 212 lb 3.2 oz (96.3 kg)   SpO2 97%   BMI 31.80 kg/m  General:  Well developed,  well nourished, no acute distress  Psych:  Alert and orientedx3,normal mood and affect HEENT:  Normocephalic, atraumatic, non-icteric sclera, PERRL, oropharynx is clear without mass or exudate, supple neck without adenopathy, mass or thyromegaly Cardiovascular:  Normal S1, S2, RRR without gallop, rub or murmur, nondisplaced PMI Respiratory:  Good breath sounds bilaterally, CTAB with normal respiratory effort Gastrointestinal: normal bowel sounds, soft, non-tender, no noted masses. No HSM MSK: OA changes bilateral hands w/o ttp, no contusions. Joints are without erythema or swelling. Spine and CVA region are nontender Skin:  Warm, no rashes or suspicious lesions noted Neurologic:    Mental status is normal. CN 2-11 are normal. Gross motor and sensory exams are normal. Normal gait. No tremor Breast Exam: right breast reconstruction w/o mass; No mass, skin retraction or nipple discharge is appreciated in either breast.  No axillary adenopathy. Fibrocystic changes are not noted   Commons side effects, risks, benefits, and alternatives for medications and treatment plan prescribed today were discussed, and the patient expressed understanding of the given instructions. Patient is instructed to call or message via MyChart if he/she has any questions or concerns regarding our treatment plan. No barriers to understanding were identified. We discussed Red Flag symptoms and signs in detail. Patient expressed understanding regarding what to do in case of urgent or emergency type symptoms.   Medication list was reconciled, printed and provided to the patient in AVS. Patient instructions and summary information was reviewed with the patient as documented in the AVS. This note was prepared with assistance of Dragon voice recognition software. Occasional wrong-word or sound-a-like substitutions may have occurred due to the inherent limitations of voice recognition software

## 2019-03-13 NOTE — Assessment & Plan Note (Signed)
cologuard ordered. avg risk patient; 1st negative 2017

## 2019-03-13 NOTE — Assessment & Plan Note (Signed)
Pt to schedule mammogram

## 2019-03-13 NOTE — Assessment & Plan Note (Signed)
Doing well on fosamax. Will recheck dexa next year. Consider drug holiday at that time.  Continue vit D and ca.

## 2019-03-13 NOTE — Assessment & Plan Note (Signed)
djd but also could be internal meniscus problem per SM. Due to daily worsening pain, rec referral to SM, Dr. Junius Roads for further eval and treatment recs.

## 2019-03-13 NOTE — Patient Instructions (Signed)
Please return in 12 months for your annual complete physical; please come fasting.  I will release your lab results to you on your MyChart account with further instructions. Please reply with any questions.   We will call you with information regarding your referral appointment. Dr. Junius Roads, sports medicine for your knee pain. If you do not hear from [place] within the next 2 weeks, please let me know. It can take 1-2 weeks to get appointments set up with the specialists.    If you have any questions or concerns, please don't hesitate to send me a message via MyChart or call the office at [phone]. Thank you for visiting with [place] today! It's our pleasure caring for you.  Please send in your cologuard test when you receive it.   Please schedule your mammogram.

## 2019-03-13 NOTE — Assessment & Plan Note (Signed)
Fasting for labs today. reeeducated on indications for statin therapy. Reordered simvastatin.

## 2019-03-14 ENCOUNTER — Other Ambulatory Visit: Payer: Self-pay | Admitting: *Deleted

## 2019-03-14 DIAGNOSIS — E782 Mixed hyperlipidemia: Secondary | ICD-10-CM

## 2019-03-17 DIAGNOSIS — M722 Plantar fascial fibromatosis: Secondary | ICD-10-CM | POA: Diagnosis not present

## 2019-03-24 ENCOUNTER — Encounter: Payer: Self-pay | Admitting: Family Medicine

## 2019-03-24 ENCOUNTER — Other Ambulatory Visit: Payer: Self-pay

## 2019-03-24 ENCOUNTER — Ambulatory Visit (INDEPENDENT_AMBULATORY_CARE_PROVIDER_SITE_OTHER): Payer: Medicare Other | Admitting: Family Medicine

## 2019-03-24 DIAGNOSIS — G8929 Other chronic pain: Secondary | ICD-10-CM | POA: Diagnosis not present

## 2019-03-24 DIAGNOSIS — M25561 Pain in right knee: Secondary | ICD-10-CM

## 2019-03-24 DIAGNOSIS — M25562 Pain in left knee: Secondary | ICD-10-CM

## 2019-03-24 NOTE — Progress Notes (Signed)
Office Visit Note   Patient: Susan Beard           Date of Birth: 1950-12-03           MRN: [id number] Visit Date: 03/24/2019 Requested by: Leamon Arnt, [place],  [place] [address] PCP: Leamon Arnt, MD  Subjective: Chief Complaint  Patient presents with  . Left Knee - Pain    Chronic pain x 1 year. Medial aspect.  . Right Knee - Pain    4 days ago, the knee "almost buckled" when she was getting out of the car. Did not fall. Pain lateral aspect, especially with going up stairs.    HPI: She is here with bilateral knee pain.  Chronic left knee pain for at least a year.  X-rays showed medial compartment and patellofemoral DJD.  She had an injection last year which really did not help.  She tried a knee brace and various anti-inflammatories but the pain continues.  Pain is all on the medial aspect of the left knee.  No locking or giving way.  Right knee started hurting 1 week ago.  She was getting out of the car and it almost buckled.  Now it is very painful when going up and down stairs or trying to walk downhill.  Years ago she had a cortisone injection in the right knee which helped quite a bit.  She has not had any troubles with it until recently.               ROS: No fevers or chills.  All other systems were reviewed and are negative.  Objective: Vital Signs: There were no vitals taken for this visit.  Physical Exam:  General:  Alert and oriented, in no acute distress. Pulm:  Breathing unlabored. Psy:  Normal mood, congruent affect. Skin: No rash on her skin. Right knee: Trace effusion, no warmth erythema.  1+ patellofemoral crepitus, no pain with patella compression.  Very tender lateral joint line but no palpable click with McMurray's. Left knee: No effusion today, no warmth or erythema.  2+ patellofemoral crepitus, mild pain with patella compression.  1+ laxity with valgus stress but still a solid endpoint.  Tender along the medial joint  line with a palpable click on McMurray's.  Ligaments feel overall stable in both knees except for left knee MCL laxity.  Imaging: None today.  Assessment & Plan: 1.  Acute right knee pain, with underlying DJD and possible lateral meniscus tear -Discussed with patient and elected to inject the right knee today.  MRI scan if symptoms persist.  2.  Chronic left knee pain with medial compartment DJD and possible meniscus tear -MRI to evaluate.  If no meniscal tear and then trial of gel injections.  Medial compartment unloading brace would be another consideration.     Procedures: Right knee steroid injection: After sterile prep with Betadine, injected 5 cc 1% lidocaine without epinephrine and 40 mg methylprednisolone from superolateral approach.    PMFS History: Patient Active Problem List   Diagnosis Date Noted  . Chronic pain of left knee 03/13/2019  . Primary osteoarthritis of knees, bilateral 03/13/2019  . Mixed hyperlipidemia 04/25/2018  . Medicare annual wellness visit, subsequent 01/20/2018  . Age-related osteoporosis without current pathological fracture 04/22/2016  . Colon cancer screening 11/26/2015  . History of right breast cancer 10/01/2015  . Primary osteoarthritis of both hands 10/01/2015   Past Medical History:  Diagnosis Date  . Age-related osteoporosis without  current pathological fracture    Dexa Scan July 2017  . Arthritis   . Breast cancer ([place]) 1989   right  . Primary osteoarthritis of knees, bilateral 03/13/2019    Family History  Problem Relation Age of Onset  . Arthritis Mother   . Hyperlipidemia Mother   . Osteoporosis Mother   . Polymyalgia rheumatica Mother   . Brain cancer Father   . Hyperlipidemia Father   . Healthy Sister   . Diabetes Daughter   . Healthy Son     Past Surgical History:  Procedure Laterality Date  . BREAST RECONSTRUCTION Right   . MASTECTOMY MODIFIED RADICAL Right 1989   Social History   Occupational History  .  Occupation: Licensed Animator: UNKNOWN  Tobacco Use  . Smoking status: Never Smoker  . Smokeless tobacco: Never Used  Substance and Sexual Activity  . Alcohol use: No  . Drug use: No  . Sexual activity: Yes    Birth control/protection: Post-menopausal

## 2019-03-28 ENCOUNTER — Ambulatory Visit: Payer: Medicare Other | Admitting: Family Medicine

## 2019-04-12 DIAGNOSIS — Z1211 Encounter for screening for malignant neoplasm of colon: Secondary | ICD-10-CM | POA: Diagnosis not present

## 2019-04-18 ENCOUNTER — Ambulatory Visit
Admission: RE | Admit: 2019-04-18 | Discharge: 2019-04-18 | Disposition: A | Discharge status: Home / Self Care | Payer: Medicare Other | Source: Ambulatory Visit | Attending: Family Medicine | Admitting: Family Medicine

## 2019-04-18 ENCOUNTER — Other Ambulatory Visit: Payer: Self-pay

## 2019-04-18 DIAGNOSIS — M25562 Pain in left knee: Secondary | ICD-10-CM | POA: Diagnosis not present

## 2019-04-18 DIAGNOSIS — G8929 Other chronic pain: Secondary | ICD-10-CM

## 2019-04-19 ENCOUNTER — Telehealth: Payer: Self-pay | Admitting: Family Medicine

## 2019-04-19 LAB — COLOGUARD: Cologuard: NEGATIVE

## 2019-04-19 NOTE — Telephone Encounter (Signed)
Left knee MRI scan shows a degenerative medial meniscus tear, but also a substantial amount of arthritis throughout the joint.  Arthroscopic surgery could potentially make the arthritis pain worse.  I think the best option would be to try gel injections, so I will request insurance approval for these.

## 2019-04-20 ENCOUNTER — Encounter: Payer: Self-pay | Admitting: Family Medicine

## 2019-04-24 NOTE — Telephone Encounter (Signed)
Noted  

## 2019-04-25 ENCOUNTER — Telehealth: Payer: Self-pay

## 2019-04-25 NOTE — Telephone Encounter (Signed)
Submitted VOB for Monovisc, left knee. 

## 2019-05-02 ENCOUNTER — Encounter: Payer: Self-pay | Admitting: Family Medicine

## 2019-05-03 DIAGNOSIS — Z1231 Encounter for screening mammogram for malignant neoplasm of breast: Secondary | ICD-10-CM | POA: Diagnosis not present

## 2019-05-03 DIAGNOSIS — Z853 Personal history of malignant neoplasm of breast: Secondary | ICD-10-CM | POA: Diagnosis not present

## 2019-05-03 LAB — HM MAMMOGRAPHY

## 2019-05-04 ENCOUNTER — Encounter: Payer: Self-pay | Admitting: Family Medicine

## 2019-05-11 ENCOUNTER — Encounter: Payer: Self-pay | Admitting: Family Medicine

## 2019-05-11 ENCOUNTER — Telehealth: Payer: Self-pay

## 2019-05-11 NOTE — Telephone Encounter (Signed)
Approved for Monovisc, Left Knee. [place] deductible met. Covered by secondary insurance at 100%. No Co-pay No PA required  Appt. 8/10/2020n with Dr. Junius Roads

## 2019-05-11 NOTE — Telephone Encounter (Signed)
Talked with patient and advised her that she has been approved for gel injection.  Appointment scheduled for Monday, 05/15/2019.

## 2019-05-12 ENCOUNTER — Ambulatory Visit: Payer: Medicare Other | Admitting: Family Medicine

## 2019-05-15 ENCOUNTER — Ambulatory Visit: Payer: Medicare Other | Admitting: Family Medicine

## 2019-05-17 ENCOUNTER — Encounter: Payer: Self-pay | Admitting: Family Medicine

## 2019-05-17 ENCOUNTER — Ambulatory Visit (INDEPENDENT_AMBULATORY_CARE_PROVIDER_SITE_OTHER): Payer: Medicare Other | Admitting: Family Medicine

## 2019-05-17 ENCOUNTER — Other Ambulatory Visit: Payer: Self-pay

## 2019-05-17 DIAGNOSIS — G8929 Other chronic pain: Secondary | ICD-10-CM

## 2019-05-17 DIAGNOSIS — M1712 Unilateral primary osteoarthritis, left knee: Secondary | ICD-10-CM | POA: Diagnosis not present

## 2019-05-17 NOTE — Progress Notes (Signed)
Subjective: She is here for a planned Monovisc injection for left knee osteoarthritis.  Objective: Trace effusion, no warmth or erythema.  Procedure: Ultrasound-guided left knee injection: Ultrasound was used to ensure intra-articular placement.  After sterile prep with Betadine, injected 3 cc 1% lidocaine without epinephrine then Monovisc from lateral midpatellar approach.  Injectate was seen filling the joint capsule.  She had good immediate relief.  Follow-up as needed.

## 2019-05-23 ENCOUNTER — Other Ambulatory Visit: Payer: Self-pay

## 2019-05-23 ENCOUNTER — Other Ambulatory Visit (INDEPENDENT_AMBULATORY_CARE_PROVIDER_SITE_OTHER): Payer: Medicare Other

## 2019-05-23 DIAGNOSIS — E782 Mixed hyperlipidemia: Secondary | ICD-10-CM

## 2019-05-23 LAB — LIPID PANEL
Cholesterol: 199 mg/dL (ref 0–200)
HDL: 74.7 mg/dL (ref >39.00)
LDL Cholesterol: 102 mg/dL — ABNORMAL HIGH (ref 0–99)
NonHDL: 124.66
Total CHOL/HDL Ratio: 3
Triglycerides: 115 mg/dL (ref 0.0–149.0)
VLDL: 23 mg/dL (ref 0.0–40.0)

## 2019-06-23 DIAGNOSIS — Z23 Encounter for immunization: Secondary | ICD-10-CM | POA: Diagnosis not present

## 2019-07-21 DIAGNOSIS — M722 Plantar fascial fibromatosis: Secondary | ICD-10-CM | POA: Diagnosis not present

## 2019-10-03 ENCOUNTER — Ambulatory Visit (INDEPENDENT_AMBULATORY_CARE_PROVIDER_SITE_OTHER): Payer: Medicare Other

## 2019-10-03 ENCOUNTER — Other Ambulatory Visit: Payer: Self-pay

## 2019-10-03 DIAGNOSIS — Z0000 Encounter for general adult medical examination without abnormal findings: Secondary | ICD-10-CM | POA: Diagnosis not present

## 2019-10-03 NOTE — Progress Notes (Signed)
This visit is being conducted via phone call due to the COVID-19 pandemic. This patient has given me verbal consent via phone to conduct this visit, patient states they are participating from their home address. Some vital signs may be absent or patient reported.   Patient identification: identified by name, DOB, and current address.  Location provider:  [facility], Office Persons participating in the virtual visit: Denman George LPN, patient, and Dr. Billey Chang   Subjective:   Susan Beard is a 68 y.o. female who presents for Medicare Annual (Subsequent) preventive examination.  Review of Systems:   Cardiac Risk Factors include: advanced age (>55men, >91 women);dyslipidemia    Objective:     Vitals: There were no vitals taken for this visit.  There is no height or weight on file to calculate BMI.  Advanced Directives 10/03/2019 09/02/2016  Does Patient Have a Medical Advance Directive? Yes No  Type of Advance Directive Living will;Healthcare Power of Attorney -  Does patient want to make changes to medical advance directive? No - Patient declined -  Copy of [place] in Chart? No - copy requested -  Would patient like information on creating a medical advance directive? - No - Patient declined    Tobacco Social History   Tobacco Use  Smoking Status Never Smoker  Smokeless Tobacco Never Used     Counseling given: Not Answered   Clinical Intake:  Pre-visit preparation completed: Yes  Pain : No/denies pain  Diabetes: No  How often do you need to have someone help you when you read instructions, pamphlets, or other written materials from your doctor or pharmacy?: 1 - Never  Interpreter Needed?: No  Information entered by :: Denman George LPN  Past Medical History:  Diagnosis Date  . Age-related osteoporosis without current pathological fracture    Dexa Scan July 2017  . Arthritis   . Breast cancer ([place]) 1989   right  . Primary  osteoarthritis of knees, bilateral 03/13/2019   Past Surgical History:  Procedure Laterality Date  . BREAST RECONSTRUCTION Right   . MASTECTOMY MODIFIED RADICAL Right 1989   Family History  Problem Relation Age of Onset  . Arthritis Mother   . Hyperlipidemia Mother   . Osteoporosis Mother   . Polymyalgia rheumatica Mother   . Brain cancer Father   . Hyperlipidemia Father   . Healthy Sister   . Diabetes Daughter   . Healthy Son    Social History   Socioeconomic History  . Marital status: Married    Spouse name: Not on file  . Number of children: 2  . Years of education: Not on file  . Highest education level: Not on file  Occupational History  . Occupation: Licensed Animator: UNKNOWN  Tobacco Use  . Smoking status: Never Smoker  . Smokeless tobacco: Never Used  Substance and Sexual Activity  . Alcohol use: No  . Drug use: No  . Sexual activity: Yes    Birth control/protection: Post-menopausal  Other Topics Concern  . Not on file  Social History Narrative   14 steps in home    Social Determinants of Health   Financial Resource Strain:   . Difficulty of Paying Living Expenses: Not on file  Food Insecurity:   . Worried About Charity fundraiser in the Last Year: Not on file  . Ran Out of Food in the Last Year: Not on file  Transportation Needs:   . Lack  of Transportation (Medical): Not on file  . Lack of Transportation (Non-Medical): Not on file  Physical Activity:   . Days of Exercise per Week: Not on file  . Minutes of Exercise per Session: Not on file  Stress:   . Feeling of Stress : Not on file  Social Connections:   . Frequency of Communication with Friends and Family: Not on file  . Frequency of Social Gatherings with Friends and Family: Not on file  . Attends Religious Services: Not on file  . Active Member of Clubs or Organizations: Not on file  . Attends Archivist Meetings: Not on file  . Marital Status: Not on file     Outpatient Encounter Medications as of 10/03/2019  Medication Sig  . alendronate (FOSAMAX) 70 MG tablet Take 1 tablet (70 mg total) by mouth once a week.  . diphenhydrAMINE (BENADRYL) 50 MG tablet Take 50 mg by mouth at bedtime as needed for itching.  . Multiple Vitamin (MULITIVITAMIN WITH MINERALS) TABS Take 1 tablet by mouth daily.  . simvastatin (ZOCOR) 10 MG tablet TAKE 1 TABLET BY MOUTH EVERYDAY AT BEDTIME  . Turmeric 500 MG CAPS Take 667 mg by mouth 2 (two) times daily. Taking as a part of an OTC medication called Relief Factor  . diclofenac sodium (VOLTAREN) 1 % GEL Apply topically to affected area qid (Patient not taking: Reported on 10/03/2019)   No facility-administered encounter medications on file as of 10/03/2019.    Activities of Daily Living In your present state of health, do you have any difficulty performing the following activities: 10/03/2019  Hearing? N  Vision? N  Difficulty concentrating or making decisions? N  Walking or climbing stairs? N  Dressing or bathing? N  Doing errands, shopping? N  Preparing Food and eating ? N  Using the Toilet? N  In the past six months, have you accidently leaked urine? N  Do you have problems with loss of bowel control? N  Managing your Medications? N  Managing your Finances? N  Housekeeping or managing your Housekeeping? N  Some recent data might be hidden    Patient Care Team: Leamon Arnt, MD as PCP - General (Family Medicine) Eunice Blase, MD as Consulting Physician (Sports Medicine)    Assessment:   This is a routine wellness examination for Susan Beard.  Exercise Activities and Dietary recommendations Current Exercise Habits: The patient does not participate in regular exercise at present  Goals   None     Fall Risk Fall Risk  10/03/2019 03/13/2019 01/20/2018  Falls in the past year? 0 0 No  Number falls in past yr: - 0 -  Injury with Fall? 0 0 -  Follow up Falls evaluation completed;Education provided;Falls  prevention discussed Falls evaluation completed -   Is the patient's home free of loose throw rugs in walkways, pet beds, electrical cords, etc?   yes      Grab bars in the bathroom? yes      Handrails on the stairs?   yes      Adequate lighting?   yes  Depression Screen PHQ 2/9 Scores 10/03/2019 03/13/2019 01/20/2018  PHQ - 2 Score 0 0 0     Cognitive Function- no cognitive concerns at this time  Cognitive Testing  Alert? Yes         Normal Appearance? N/a  Oriented to person? Yes           Place? Yes  Time? Yes  Recall of three objects?  Yes  Can perform simple calculations? Yes  Displays appropriate judgment? Yes  Can read the correct time from a watch face? Yes   Immunization History  Administered Date(s) Administered  . Influenza, High Dose Seasonal PF 06/16/2018, 06/23/2019  . Pneumococcal Conjugate-13 10/16/2015  . Pneumococcal Polysaccharide-23 12/18/2016  . Tdap 09/30/2008  . Zoster 09/30/2005  . Zoster Recombinat (Shingrix) 06/16/2018, 11/11/2018    Qualifies for Shingles Vaccine? Shingrix completed   Screening Tests Health Maintenance  Topic Date Due  . TETANUS/TDAP  09/30/2018  . DEXA SCAN  04/21/2020  . MAMMOGRAM  05/02/2020  . Fecal DNA (Cologuard)  04/11/2022  . INFLUENZA VACCINE  Completed  . Hepatitis C Screening  Completed  . PNA vac Low Risk Adult  Completed    Cancer Screenings: Lung: Low Dose CT Chest recommended if Age 36-80 years, 30 pack-year currently smoking OR have quit w/in 15years. Patient does not qualify. Breast:  Up to date on Mammogram? Yes   Up to date of Bone Density/Dexa? Yes Colorectal: Cologuard normal 04/19/19   Plan:  I have personally reviewed and addressed the Medicare Annual Wellness questionnaire and have noted the following in the patient's chart:  A. Medical and social history B. Use of alcohol, tobacco or illicit drugs  C. Current medications and supplements D. Functional ability and status E.  Nutritional status F.   Physical activity G. Advance directives H. List of other physicians I.  Hospitalizations, surgeries, and ER visits in previous 12 months J.  [place] such as hearing and vision if needed, cognitive and depression L. Referrals, records requested, and appointments- none   In addition, I have reviewed and discussed with patient certain preventive protocols, quality metrics, and best practice recommendations. A written personalized care plan for preventive services as well as general preventive health recommendations were provided to patient.   Signed,  Denman George, LPN  Nurse Health Advisor   Nurse Notes: Patient with complaints of sinus headache.  Will try otc medication, rest, and increased fluid intake.  Will call back if symptoms persist.

## 2019-10-03 NOTE — Patient Instructions (Signed)
[name] , Thank you for taking time to come for your Medicare Wellness Visit. I appreciate your ongoing commitment to your health goals. Please review the following plan we discussed and let me know if I can assist you in the future.   Screening recommendations/referrals: Colorectal Screening: up to date; Cologuard 04/19/19 (next due 04/2022) Mammogram: up to date; last 05/03/19 Bone Density: up to date; last 04/21/18   Vision and Dental Exams: Recommended annual ophthalmology exams for early detection of glaucoma and other disorders of the eye Recommended annual dental exams for proper oral hygiene  Vaccinations: Influenza vaccine: completed 06/23/19 Pneumococcal vaccine: up to date; last 12/18/16 Tdap vaccine: recommended; Please call your insurance company to determine your out of pocket expense. You may receive this vaccine at your local pharmacy or Health Dept. Will discuss further at next visit.  Shingles vaccine: Shingrix completed   Advanced directives: Please bring a copy of your POA (Power of Attorney) and/or Living Will to your next appointment.  Goals: Recommend to drink at least 6-8 8oz glasses of water per day and consume a balanced diet rich in fresh fruits and vegetables.   Next appointment: Please schedule your Annual Wellness Visit with your Nurse Health Advisor in one year.  Preventive Care 20 Years and Older, Female Preventive care refers to lifestyle choices and visits with your health care provider that can promote health and wellness. What does preventive care include?  A yearly physical exam. This is also called an annual well check.  Dental exams once or twice a year.  Routine eye exams. Ask your health care provider how often you should have your eyes checked.  Personal lifestyle choices, including:  Daily care of your teeth and gums.  Regular physical activity.  Eating a healthy diet.  Avoiding tobacco and drug use.  Limiting alcohol  use.  Practicing safe sex.  Taking low-dose aspirin every day if recommended by your health care provider.  Taking vitamin and mineral supplements as recommended by your health care provider. What happens during an annual well check? The services and screenings done by your health care provider during your annual well check will depend on your age, overall health, lifestyle risk factors, and family history of disease. Counseling  Your health care provider may ask you questions about your:  Alcohol use.  Tobacco use.  Drug use.  Emotional well-being.  Home and relationship well-being.  Sexual activity.  Eating habits.  History of falls.  Memory and ability to understand (cognition).  Work and work Statistician.  Reproductive health. Screening  You may have the following tests or measurements:  Height, weight, and BMI.  Blood pressure.  Lipid and cholesterol levels. These may be checked every 5 years, or more frequently if you are over 17 years old.  Skin check.  Lung cancer screening. You may have this screening every year starting at age 41 if you have a 30-pack-year history of smoking and currently smoke or have quit within the past 15 years.  Fecal occult blood test (FOBT) of the stool. You may have this test every year starting at age 40.  Flexible sigmoidoscopy or colonoscopy. You may have a sigmoidoscopy every 5 years or a colonoscopy every 10 years starting at age 23.  Hepatitis C blood test.  Hepatitis B blood test.  Sexually transmitted disease (STD) testing.  Diabetes screening. This is done by checking your blood sugar (glucose) after you have not eaten for a while (fasting). You may have this done  every 1-3 years.  Bone density scan. This is done to screen for osteoporosis. You may have this done starting at age 51.  Mammogram. This may be done every 1-2 years. Talk to your health care provider about how often you should have regular  mammograms. Talk with your health care provider about your test results, treatment options, and if necessary, the need for more tests. Vaccines  Your health care provider may recommend certain vaccines, such as:  Influenza vaccine. This is recommended every year.  Tetanus, diphtheria, and acellular pertussis (Tdap, Td) vaccine. You may need a Td booster every 10 years.  Zoster vaccine. You may need this after age 5.  Pneumococcal 13-valent conjugate (PCV13) vaccine. One dose is recommended after age 41.  Pneumococcal polysaccharide (PPSV23) vaccine. One dose is recommended after age 75. Talk to your health care provider about which screenings and vaccines you need and how often you need them. This information is not intended to replace advice given to you by your health care provider. Make sure you discuss any questions you have with your health care provider. Document Released: 10/18/2015 Document Revised: 06/10/2016 Document Reviewed: 07/23/2015 Elsevier Interactive Patient Education  2017 [place] Prevention in the Home Falls can cause injuries. They can happen to people of all ages. There are many things you can do to make your home safe and to help prevent falls. What can I do on the outside of my home?  Regularly fix the edges of walkways and driveways and fix any cracks.  Remove anything that might make you trip as you walk through a door, such as a raised step or threshold.  Trim any bushes or trees on the path to your home.  Use bright outdoor lighting.  Clear any walking paths of anything that might make someone trip, such as rocks or tools.  Regularly check to see if handrails are loose or broken. Make sure that both sides of any steps have handrails.  Any raised decks and porches should have guardrails on the edges.  Have any leaves, snow, or ice cleared regularly.  Use sand or salt on walking paths during winter.  Clean up any spills in your garage  right away. This includes oil or grease spills. What can I do in the bathroom?  Use night lights.  Install grab bars by the toilet and in the tub and shower. Do not use towel bars as grab bars.  Use non-skid mats or decals in the tub or shower.  If you need to sit down in the shower, use a plastic, non-slip stool.  Keep the floor dry. Clean up any water that spills on the floor as soon as it happens.  Remove soap buildup in the tub or shower regularly.  Attach bath mats securely with double-sided non-slip rug tape.  Do not have throw rugs and other things on the floor that can make you trip. What can I do in the bedroom?  Use night lights.  Make sure that you have a light by your bed that is easy to reach.  Do not use any sheets or blankets that are too big for your bed. They should not hang down onto the floor.  Have a firm chair that has side arms. You can use this for support while you get dressed.  Do not have throw rugs and other things on the floor that can make you trip. What can I do in the kitchen?  Clean up any spills right  away.  Avoid walking on wet floors.  Keep items that you use a lot in easy-to-reach places.  If you need to reach something above you, use a strong step stool that has a grab bar.  Keep electrical cords out of the way.  Do not use floor polish or wax that makes floors slippery. If you must use wax, use non-skid floor wax.  Do not have throw rugs and other things on the floor that can make you trip. What can I do with my stairs?  Do not leave any items on the stairs.  Make sure that there are handrails on both sides of the stairs and use them. Fix handrails that are broken or loose. Make sure that handrails are as long as the stairways.  Check any carpeting to make sure that it is firmly attached to the stairs. Fix any carpet that is loose or worn.  Avoid having throw rugs at the top or bottom of the stairs. If you do have throw rugs,  attach them to the floor with carpet tape.  Make sure that you have a light switch at the top of the stairs and the bottom of the stairs. If you do not have them, ask someone to add them for you. What else can I do to help prevent falls?  Wear shoes that:  Do not have high heels.  Have rubber bottoms.  Are comfortable and fit you well.  Are closed at the toe. Do not wear sandals.  If you use a stepladder:  Make sure that it is fully opened. Do not climb a closed stepladder.  Make sure that both sides of the stepladder are locked into place.  Ask someone to hold it for you, if possible.  Clearly mark and make sure that you can see:  Any grab bars or handrails.  First and last steps.  Where the edge of each step is.  Use tools that help you move around (mobility aids) if they are needed. These include:  Canes.  Walkers.  Scooters.  Crutches.  Turn on the lights when you go into a dark area. Replace any light bulbs as soon as they burn out.  Set up your furniture so you have a clear path. Avoid moving your furniture around.  If any of your floors are uneven, fix them.  If there are any pets around you, be aware of where they are.  Review your medicines with your doctor. Some medicines can make you feel dizzy. This can increase your chance of falling. Ask your doctor what other things that you can do to help prevent falls. This information is not intended to replace advice given to you by your health care provider. Make sure you discuss any questions you have with your health care provider. Document Released: 07/18/2009 Document Revised: 02/27/2016 Document Reviewed: 10/26/2014 Elsevier Interactive Patient Education  2017 [employer].

## 2019-10-10 ENCOUNTER — Ambulatory Visit: Payer: Medicare Other | Attending: Internal Medicine

## 2019-10-10 DIAGNOSIS — Z20822 Contact with and (suspected) exposure to covid-19: Secondary | ICD-10-CM | POA: Diagnosis not present

## 2019-10-11 ENCOUNTER — Other Ambulatory Visit: Payer: Medicare Other

## 2019-10-12 LAB — NOVEL CORONAVIRUS, NAA: SARS-CoV-2, NAA: DETECTED — AB

## 2019-10-13 ENCOUNTER — Encounter: Payer: Self-pay | Admitting: Family Medicine

## 2019-11-22 ENCOUNTER — Encounter: Payer: Self-pay | Admitting: Family Medicine

## 2019-11-27 ENCOUNTER — Encounter: Payer: Self-pay | Admitting: Family Medicine

## 2019-11-27 ENCOUNTER — Ambulatory Visit (INDEPENDENT_AMBULATORY_CARE_PROVIDER_SITE_OTHER): Payer: Medicare Other | Admitting: Family Medicine

## 2019-11-27 ENCOUNTER — Ambulatory Visit (INDEPENDENT_AMBULATORY_CARE_PROVIDER_SITE_OTHER): Payer: Medicare Other

## 2019-11-27 VITALS — BP 138/88 | HR 83 | Temp 98.0°F | Resp 15 | Wt 214.6 lb

## 2019-11-27 DIAGNOSIS — S322XXA Fracture of coccyx, initial encounter for closed fracture: Secondary | ICD-10-CM | POA: Diagnosis not present

## 2019-11-27 DIAGNOSIS — S300XXA Contusion of lower back and pelvis, initial encounter: Secondary | ICD-10-CM

## 2019-11-27 DIAGNOSIS — M810 Age-related osteoporosis without current pathological fracture: Secondary | ICD-10-CM

## 2019-11-27 NOTE — Progress Notes (Signed)
Subjective  CC:  Chief Complaint  Patient presents with  . Fall    slipped and fell on ice 3 weeks ago, injury to tail bone    HPI: Susan Beard is a 69 y.o. female who presents to the office today to address the problems listed above in the chief complaint.  See phone note; pt slipped on ice and fell down 5 brick steps outside [facility] 11/12/2019, landing directly on buttocks and sliding down steps. Had mild to moderate pain initially that worsened over the first week. She managed pain with oral ibuprofen which helped. However, pain persists. Still with pain upon sitting back on firm chair, pain with standing from seated position. Does well if seat has support or gel or if leans forward. No pain with sleeping. No radicular sxs. No longer needing ibuprofen. No b/b dysfunction. She does have osteoporosis on fosamax.   Assessment  1. Contusion of coccyx, initial encounter   2. Age-related osteoporosis without current pathological fracture      Plan   Suspect contusion of coccyx:  Reassured. Check xray to r/o fracture. rec ibuprofen and time and supports in chairs etc. See avs.   Follow up: No follow-ups on file.  03/19/2020  Orders Placed This Encounter  Procedures  . DG Sacrum/Coccyx   No orders of the defined types were placed in this encounter.     I reviewed the patients updated PMH, FH, and SocHx.    Patient Active Problem List   Diagnosis Date Noted  . Chronic pain of left knee 03/13/2019  . Primary osteoarthritis of knees, bilateral 03/13/2019  . Mixed hyperlipidemia 04/25/2018  . Medicare annual wellness visit, subsequent 01/20/2018  . Age-related osteoporosis without current pathological fracture 04/22/2016  . Colon cancer screening 11/26/2015  . History of right breast cancer 10/01/2015  . Primary osteoarthritis of both hands 10/01/2015   Current Meds  Medication Sig  . alendronate (FOSAMAX) 70 MG tablet Take 1 tablet (70 mg total) by mouth once a week.  .  diclofenac sodium (VOLTAREN) 1 % GEL Apply topically to affected area qid  . diphenhydrAMINE (BENADRYL) 50 MG tablet Take 50 mg by mouth at bedtime as needed for itching.  . Multiple Vitamin (MULITIVITAMIN WITH MINERALS) TABS Take 1 tablet by mouth daily.  . simvastatin (ZOCOR) 10 MG tablet TAKE 1 TABLET BY MOUTH EVERYDAY AT BEDTIME  . Turmeric 500 MG CAPS Take 667 mg by mouth 2 (two) times daily. Taking as a part of an OTC medication called Relief Factor    Allergies: Patient is allergic to codeine. Family History: Patient family history includes Arthritis in her mother; Brain cancer in her father; Diabetes in her daughter; Healthy in her sister and son; Hyperlipidemia in her father and mother; Osteoporosis in her mother; Polymyalgia rheumatica in her mother. Social History:  Patient  reports that she has never smoked. She has never used smokeless tobacco. She reports that she does not drink alcohol or use drugs.  Review of Systems: Constitutional: Negative for fever malaise or anorexia Cardiovascular: negative for chest pain Respiratory: negative for SOB or persistent cough Gastrointestinal: negative for abdominal pain  Objective  Vitals: BP 138/88   Pulse 83   Temp 98 F (36.7 C) (Temporal)   Resp 15   Wt 214 lb 9.6 oz (97.3 kg)   SpO2 99%   BMI 32.16 kg/m  General: no acute distress , A&Ox3 Back: no spinal ttp except distal coccyx; no SI joint ttp. Slow to stand but  normal gait.     Commons side effects, risks, benefits, and alternatives for medications and treatment plan prescribed today were discussed, and the patient expressed understanding of the given instructions. Patient is instructed to call or message via MyChart if he/she has any questions or concerns regarding our treatment plan. No barriers to understanding were identified. We discussed Red Flag symptoms and signs in detail. Patient expressed understanding regarding what to do in case of urgent or emergency type  symptoms.   Medication list was reconciled, printed and provided to the patient in AVS. Patient instructions and summary information was reviewed with the patient as documented in the AVS. This note was prepared with assistance of Dragon voice recognition software. Occasional wrong-word or sound-a-like substitutions may have occurred due to the inherent limitations of voice recognition software  This visit occurred during the SARS-CoV-2 public health emergency.  Safety protocols were in place, including screening questions prior to the visit, additional usage of staff PPE, and extensive cleaning of exam room while observing appropriate contact time as indicated for disinfecting solutions.

## 2019-11-27 NOTE — Patient Instructions (Signed)
Please follow up as scheduled for your next visit with me: 03/19/2020   If you have any questions or concerns, please don't hesitate to send me a message via MyChart or call the office at [phone]. Thank you for visiting with [place] today! It's our pleasure caring for you.  Use ibuprofen as needed and supports/gels for chairs.  Pain like this can linger, but should eventually resolve.   Tailbone Injury  The tailbone (coccyx) is the small bone at the lower end of the spine. A tailbone injury may involve stretched ligaments, bruising, or a broken bone (fracture). Tailbone injuries can be painful, and some may take a long time to heal. What are the causes? This condition may be caused by:  Falling and landing on the tailbone.  Repeated strain or friction from sitting for long periods of time. This may include actions such as rowing and bicycling.  Childbirth. In some cases, the cause may not be known. What are the signs or symptoms? Symptoms of this condition include:  Pain in the tailbone area or lower back, especially when sitting.  Pain or difficulty when standing up from a sitting position.  Bruising or swelling in the tailbone area.  Painful bowel movements.  In women, pain during intercourse. How is this diagnosed? This condition may be diagnosed based on:  Your symptoms.  A physical exam. If your health care provider suspects a fracture, you may have additional tests, such as:  X-rays.  CT scan.  MRI. How is this treated? Most tailbone injuries heal on their own in 4-6 weeks. However, recovery time may be longer if the injury involves a fracture. Treatment for this condition may include:  NSAIDs or other over-the-counter medicines to help relieve your pain.  Using a large, rubber or inflated ring or cushion to take pressure off the tailbone when sitting.  Physical therapy.  Injecting the tailbone area with local anesthesia and steroid medicine. This is not  normally needed unless the pain does not improve over time with over-the-counter pain medicines. Follow these instructions at home: Activity  Avoid sitting for long periods of time.  To prevent repeating an injury that is caused by strain or friction: ? Wear appropriate padding and sports gear when bicycling and rowing.  Increase your activity as the pain allows. Perform any exercises that are recommended by your health care provider or physical therapist. Managing pain, stiffness, and swelling  To help decrease discomfort when sitting: ? Sit on your rubber or inflated ring or cushion as told by your health care provider. ? Lean forward when you sit.  If directed, apply ice to the injured area: ? Put ice in a plastic bag. ? Place a towel between your skin and the bag. ? Leave the ice on for 20 minutes, 2-3 times per day for the first 1-2 days.  If directed, apply heat to the affected area as often as told by your health care provider. Use the heat source that your health care provider recommends, such as a moist heat pack or a heating pad. ? Place a towel between your skin and the heat source. ? Leave the heat on for 20-30 minutes. ? Remove the heat if your skin turns bright red. This is especially important if you are unable to feel pain, heat, or cold. You may have a greater risk of getting burned. General instructions  Take over-the-counter and prescription medicines only as told by your health care provider.  To prevent or treat constipation or  painful bowel movements, your health care provider may recommend that you: ? Drink enough fluid to keep your urine pale yellow. ? Eat foods that are high in fiber, such as fresh fruits and vegetables, whole grains, and beans. ? Limit foods that are high in fat and processed sugars, such as fried and sweet foods. ? Take an over-the-counter or prescription medicine for constipation.  Keep all follow-up visits as directed by your health  care provider. This is important. Contact a health care provider if:  Your pain becomes worse or is not controlled with medicine.  Your bowel movements cause a great deal of discomfort.  You are unable to have a bowel movement after 4 days.  You have pain during intercourse. Summary  A tailbone injury may involve stretched ligaments, bruising, or a broken bone (fracture).  Tailbone injuries can be painful. Most heal on their own in 4-6 weeks.  Treatment may include taking NSAIDs, using a rubber or inflated ring or cushion when sitting, and physical therapy.  Follow any recommendations from your health care provider to prevent or treat constipation. This information is not intended to replace advice given to you by your health care provider. Make sure you discuss any questions you have with your health care provider. Document Revised: 10/19/2017 Document Reviewed: 10/19/2017 Elsevier Patient Education  2020 [employer].

## 2019-12-10 ENCOUNTER — Ambulatory Visit: Payer: Medicare Other | Attending: Internal Medicine

## 2019-12-10 DIAGNOSIS — Z23 Encounter for immunization: Secondary | ICD-10-CM | POA: Insufficient documentation

## 2019-12-10 NOTE — Progress Notes (Signed)
   Covid-19 Vaccination Clinic  Name:  Susan Beard    MRN: [id number] DOB: Aug 06, 1951  12/10/2019  [name] was observed post Covid-19 immunization for 15 minutes without incident. She was provided with Vaccine Information Sheet and instruction to access the V-Safe system.   [name] was instructed to call 911 with any severe reactions post vaccine: Marland Kitchen Difficulty breathing  . Swelling of face and throat  . A fast heartbeat  . A bad rash all over body  . Dizziness and weakness   Immunizations Administered    Name Date Dose VIS Date Route   Pfizer COVID-19 Vaccine 12/10/2019  6:26 PM 0.3 mL 09/15/2019 Intramuscular   Manufacturer: [place]   Lot: [id number]   [place]: [id number]

## 2020-01-10 ENCOUNTER — Ambulatory Visit: Payer: Medicare Other | Attending: Internal Medicine

## 2020-01-10 DIAGNOSIS — Z23 Encounter for immunization: Secondary | ICD-10-CM

## 2020-01-10 NOTE — Progress Notes (Signed)
   Covid-19 Vaccination Clinic  Name:  Susan Beard    MRN: [id number] DOB: 1951-01-14  01/10/2020  [name] was observed post Covid-19 immunization for 15 minutes without incident. She was provided with Vaccine Information Sheet and instruction to access the V-Safe system.   [name] was instructed to call 911 with any severe reactions post vaccine: Marland Kitchen Difficulty breathing  . Swelling of face and throat  . A fast heartbeat  . A bad rash all over body  . Dizziness and weakness   Immunizations Administered    Name Date Dose VIS Date Route   Pfizer COVID-19 Vaccine 01/10/2020  1:37 PM 0.3 mL 09/15/2019 Intramuscular   Manufacturer: [place]   Lot: [id number]   [place]: [id number]

## 2020-02-20 ENCOUNTER — Other Ambulatory Visit: Payer: Self-pay | Admitting: Family Medicine

## 2020-02-21 ENCOUNTER — Ambulatory Visit (INDEPENDENT_AMBULATORY_CARE_PROVIDER_SITE_OTHER): Payer: Medicare Other | Admitting: Family Medicine

## 2020-02-21 ENCOUNTER — Encounter: Payer: Self-pay | Admitting: Family Medicine

## 2020-02-21 ENCOUNTER — Other Ambulatory Visit: Payer: Self-pay

## 2020-02-21 VITALS — BP 128/70 | HR 87 | Temp 98.0°F | Resp 18 | Ht 68.5 in | Wt 214.6 lb

## 2020-02-21 DIAGNOSIS — L720 Epidermal cyst: Secondary | ICD-10-CM | POA: Diagnosis not present

## 2020-02-21 NOTE — Progress Notes (Signed)
Subjective  CC:  Chief Complaint  Patient presents with  . Skin Check    Lump on her back is getting bigger. She would like to disuss getting it removed in office. Lump is located the top of her back on the left side.    HPI: Susan Beard is a 69 y.o. female who presents to the office today to address the problems listed above in the chief complaint.  Epidermal cyst on upper center back: enlarging. Painful when leans on things. Not red or sore or draining. Would like it excised.    Assessment  1. Epidermoid cyst of skin      Plan   Excision of cyst, benign:  Routine post procedure care instructions were given to patient in detail. See avs. Return in 7 days for SR. Discussed risks of coming back.   Follow up: 1 week for SR  03/19/2020  No orders of the defined types were placed in this encounter.  No orders of the defined types were placed in this encounter.     I reviewed the patients updated PMH, FH, and SocHx.    Patient Active Problem List   Diagnosis Date Noted  . Chronic pain of left knee 03/13/2019  . Primary osteoarthritis of knees, bilateral 03/13/2019  . Mixed hyperlipidemia 04/25/2018  . Medicare annual wellness visit, subsequent 01/20/2018  . Age-related osteoporosis without current pathological fracture 04/22/2016  . Colon cancer screening 11/26/2015  . History of right breast cancer 10/01/2015  . Primary osteoarthritis of both hands 10/01/2015   Current Meds  Medication Sig  . alendronate (FOSAMAX) 70 MG tablet TAKE 1 TABLET BY MOUTH ONE TIME PER WEEK  . diphenhydrAMINE (BENADRYL) 50 MG tablet Take 50 mg by mouth at bedtime as needed for itching.  . Multiple Vitamin (MULITIVITAMIN WITH MINERALS) TABS Take 1 tablet by mouth daily.  . simvastatin (ZOCOR) 10 MG tablet TAKE 1 TABLET BY MOUTH EVERYDAY AT BEDTIME    Allergies: Patient is allergic to codeine. Family History: Patient family history includes Arthritis in her mother; Brain cancer in her  father; Diabetes in her daughter; Healthy in her sister and son; Hyperlipidemia in her father and mother; Osteoporosis in her mother; Polymyalgia rheumatica in her mother. Social History:  Patient  reports that she has never smoked. She has never used smokeless tobacco. She reports that she does not drink alcohol or use drugs.  Review of Systems: Constitutional: Negative for fever malaise or anorexia Cardiovascular: negative for chest pain Respiratory: negative for SOB or persistent cough Gastrointestinal: negative for abdominal pain  Objective  Vitals: BP 128/70   Pulse 87   Temp 98 F (36.7 C) (Temporal)   Resp 18   Ht 5' 8.5" (1.74 m)   Wt 214 lb 9.6 oz (97.3 kg)   SpO2 97%   BMI 32.16 kg/m  General: no acute distress , A&Ox3 Skin:  Warm, no rashes Upper mid back with 1.2cm nontender cyst.  Sebaceous Cyst Excision Procedure Note  Pre-operative Diagnosis: Epidermal cyst  Post-operative Diagnosis: same  Locations:middle upper back  Indications: painful and growing. Prevent infection.   Anesthesia: Lidocaine 1% with epinephrine without added sodium bicarbonate  Procedure Details  History of allergy to iodine: no  Patient informed of the risks (including bleeding and infection) and benefits of the  procedure and Verbal informed consent obtained.  The lesion and surrounding area was given a sterile prep using betadyne and draped in the usual sterile fashion. An incision was made over the  cyst, which was dissected free of the surrounding tissue and removed.  The cyst was filled with typical sebaceous material.  The wound was closed with 5-0 Prolene using 3 simple interrupted stitches. Antibiotic ointment and a sterile dressing applied.  The specimen was not sent for pathologic examination. The patient tolerated the procedure well.  EBL: 0 ml  Findings: Inclusion cyst benign  Condition: Stable  Complications: none.      Commons side effects, risks, benefits,  and alternatives for medications and treatment plan prescribed today were discussed, and the patient expressed understanding of the given instructions. Patient is instructed to call or message via MyChart if he/she has any questions or concerns regarding our treatment plan. No barriers to understanding were identified. We discussed Red Flag symptoms and signs in detail. Patient expressed understanding regarding what to do in case of urgent or emergency type symptoms.   Medication list was reconciled, printed and provided to the patient in AVS. Patient instructions and summary information was reviewed with the patient as documented in the AVS. This note was prepared with assistance of Dragon voice recognition software. Occasional wrong-word or sound-a-like substitutions may have occurred due to the inherent limitations of voice recognition software  This visit occurred during the SARS-CoV-2 public health emergency.  Safety protocols were in place, including screening questions prior to the visit, additional usage of staff PPE, and extensive cleaning of exam room while observing appropriate contact time as indicated for disinfecting solutions.

## 2020-02-21 NOTE — Patient Instructions (Signed)
Please return in 1 week for suture removal.  If you have any questions or concerns, please don't hesitate to send me a message via MyChart or call the office at [phone]. Thank you for visiting with Susan Beard today! It's our pleasure caring for you.   Wound Care, Adult Taking care of your wound properly can help to prevent pain, infection, and scarring. It can also help your wound to heal more quickly. How to care for your wound Wound care      Follow instructions from your health care provider about how to take care of your wound. Make sure you: ? Wash your hands with soap and water before you change the bandage (dressing). If soap and water are not available, use hand sanitizer. ? Change your dressing as told by your health care provider. ? Leave stitches (sutures), skin glue, or adhesive strips in place. These skin closures may need to stay in place for 2 weeks or longer. If adhesive strip edges start to loosen and curl up, you may trim the loose edges. Do not remove adhesive strips completely unless your health care provider tells you to do that.  Check your wound area every day for signs of infection. Check for: ? Redness, swelling, or pain. ? Fluid or blood. ? Warmth. ? Pus or a bad smell.  Ask your health care provider if you should clean the wound with mild soap and water. Doing this may include: ? Using a clean towel to pat the wound dry after cleaning it. Do not rub or scrub the wound. ? Applying a cream or ointment. Do this only as told by your health care provider. ? Covering the incision with a clean dressing.  Ask your health care provider when you can leave the wound uncovered.  Keep the dressing dry until your health care provider says it can be removed. Do not take baths, swim, use a hot tub, or do anything that would put the wound underwater until your health care provider approves. Ask your health care provider if you can take showers. You may only be allowed to take  sponge baths. Medicines   If you were prescribed an antibiotic medicine, cream, or ointment, take or use the antibiotic as told by your health care provider. Do not stop taking or using the antibiotic even if your condition improves.  Take over-the-counter and prescription medicines only as told by your health care provider. If you were prescribed pain medicine, take it 30 or more minutes before you do any wound care or as told by your health care provider. General instructions  Return to your normal activities as told by your health care provider. Ask your health care provider what activities are safe.  Do not scratch or pick at the wound.  Do not use any products that contain nicotine or tobacco, such as cigarettes and e-cigarettes. These may delay wound healing. If you need help quitting, ask your health care provider.  Keep all follow-up visits as told by your health care provider. This is important.  Eat a diet that includes protein, vitamin A, vitamin C, and other nutrient-rich foods to help the wound heal. ? Foods rich in protein include meat, dairy, beans, nuts, and other sources. ? Foods rich in vitamin A include carrots and dark green, leafy vegetables. ? Foods rich in vitamin C include citrus, tomatoes, and other fruits and vegetables. ? Nutrient-rich foods have protein, carbohydrates, fat, vitamins, or minerals. Eat a variety of healthy foods including vegetables, fruits,  and whole grains. Contact a health care provider if:  You received a tetanus shot and you have swelling, severe pain, redness, or bleeding at the injection site.  Your pain is not controlled with medicine.  You have redness, swelling, or pain around the wound.  You have fluid or blood coming from the wound.  Your wound feels warm to the touch.  You have pus or a bad smell coming from the wound.  You have a fever or chills.  You are nauseous or you vomit.  You are dizzy. Get help right away  if:  You have a red streak going away from your wound.  The edges of the wound open up and separate.  Your wound is bleeding, and the bleeding does not stop with gentle pressure.  You have a rash.  You faint.  You have trouble breathing. Summary  Always wash your hands with soap and water before changing your bandage (dressing).  To help with healing, eat foods that are rich in protein, vitamin A, vitamin C, and other nutrients.  Check your wound every day for signs of infection. Contact your health care provider if you suspect that your wound is infected. This information is not intended to replace advice given to you by your health care provider. Make sure you discuss any questions you have with your health care provider. Document Revised: 01/09/2019 Document Reviewed: 04/07/2016 Elsevier Patient Education  [place].   Epidermal Cyst  An epidermal cyst is a sac made of skin tissue. The sac contains a substance called keratin. Keratin is a protein that is normally secreted through the hair follicles. When keratin becomes trapped in the top layer of skin (epidermis), it can form an epidermal cyst. Epidermal cysts can be found anywhere on your body. These cysts are usually harmless (benign), and they may not cause symptoms unless they become infected. What are the causes? This condition may be caused by:  A blocked hair follicle.  A hair that curls and re-enters the skin instead of growing straight out of the skin (ingrown hair).  A blocked pore.  Irritated skin.  An injury to the skin.  Certain conditions that are passed along from parent to child (inherited).  Human papillomavirus (HPV).  Long-term (chronic) sun damage to the skin. What increases the risk? The following factors may make you more likely to develop an epidermal cyst:  Having acne.  Being overweight.  Being 10-55 years old. What are the signs or symptoms? The only symptom of this  condition may be a small, painless lump underneath the skin. When an epidermal cyst ruptures, it may become infected. Symptoms may include:  Redness.  Inflammation.  Tenderness.  Warmth.  Fever.  Keratin draining from the cyst. Keratin is grayish-white, bad-smelling substance.  Pus draining from the cyst. How is this diagnosed? This condition is diagnosed with a physical exam.  In some cases, you may have a sample of tissue (biopsy) taken from your cyst to be examined under a microscope or tested for bacteria.  You may be referred to a health care provider who specializes in skin care (dermatologist). How is this treated? In many cases, epidermal cysts go away on their own without treatment. If a cyst becomes infected, treatment may include:  Opening and draining the cyst, done by a health care provider. After draining, minor surgery to remove the rest of the cyst may be done.  Antibiotic medicine.  Injections of medicines (steroids) that help to reduce inflammation.  Surgery to remove the cyst. Surgery may be done if the cyst: ? Becomes large. ? Bothers you. ? Has a chance of turning into cancer.  Do not try to open a cyst yourself. Follow these instructions at home:  Take over-the-counter and prescription medicines only as told by your health care provider.  If you were prescribed an antibiotic medicine, take it it as told by your health care provider. Do not stop using the antibiotic even if you start to feel better.  Keep the area around your cyst clean and dry.  Wear loose, dry clothing.  Avoid touching your cyst.  Check your cyst every day for signs of infection. Check for: ? Redness, swelling, or pain. ? Fluid or blood. ? Warmth. ? Pus or a bad smell.  Keep all follow-up visits as told by your health care provider. This is important. How is this prevented?  Wear clean, dry, clothing.  Avoid wearing tight clothing.  Keep your skin clean and dry. Take  showers or baths every day. Contact a health care provider if:  Your cyst develops symptoms of infection.  Your condition is not improving or is getting worse.  You develop a cyst that looks different from other cysts you have had.  You have a fever. Get help right away if:  Redness spreads from the cyst into the surrounding area. Summary  An epidermal cyst is a sac made of skin tissue. These cysts are usually harmless (benign), and they may not cause symptoms unless they become infected.  If a cyst becomes infected, treatment may include surgery to open and drain the cyst, or to remove it. Treatment may also include medicines by mouth or through an injection.  Take over-the-counter and prescription medicines only as told by your health care provider. If you were prescribed an antibiotic medicine, take it as told by your health care provider. Do not stop using the antibiotic even if you start to feel better.  Contact a health care provider if your condition is not improving or is getting worse.  Keep all follow-up visits as told by your health care provider. This is important. This information is not intended to replace advice given to you by your health care provider. Make sure you discuss any questions you have with your health care provider. Document Revised: 01/12/2019 Document Reviewed: 04/04/2018 Elsevier Patient Education  [place].

## 2020-02-28 ENCOUNTER — Ambulatory Visit (INDEPENDENT_AMBULATORY_CARE_PROVIDER_SITE_OTHER): Payer: Medicare Other | Admitting: Family Medicine

## 2020-02-28 ENCOUNTER — Other Ambulatory Visit: Payer: Self-pay

## 2020-02-28 ENCOUNTER — Encounter: Payer: Self-pay | Admitting: Family Medicine

## 2020-02-28 VITALS — BP 152/78 | HR 74 | Temp 98.0°F | Resp 15 | Ht 69.0 in | Wt 214.6 lb

## 2020-02-28 DIAGNOSIS — Z4802 Encounter for removal of sutures: Secondary | ICD-10-CM

## 2020-02-28 NOTE — Progress Notes (Signed)
   Subjective  CC:  Chief Complaint  Patient presents with  . Suture / Staple Removal    HPI: Susan Beard is a 69 y.o. female who presents to the office today to address the problems listed above in the chief complaint.  See last visit; s/p sebaceous cyst removal. Here for SR. No problems.   Assessment  1. Visit for suture removal      Plan   SR:  No problems. Monitor for cyst recurrence.  Follow up: cpe  03/19/2020  No orders of the defined types were placed in this encounter.  No orders of the defined types were placed in this encounter.     I reviewed the patients updated PMH, FH, and SocHx.    Objective  Vitals: BP (!) 152/78   Pulse 74   Temp 98 F (36.7 C) (Temporal)   Resp 15   Ht 5\' 9"  (1.753 m)   Wt 214 lb 9.6 oz (97.3 kg)   SpO2 97%   BMI 31.69 kg/m  General: no acute distress , A&Ox3 Skin: upper back with closed well healed incision w/ 3 interrupted sutures in place. Mildly indurated wound edges. nontender  3 sutures removed in routine fashion after area cleansed with alcohol. Pt tolerated well.     Commons side effects, risks, benefits, and alternatives for medications and treatment plan prescribed today were discussed, and the patient expressed understanding of the given instructions. Patient is instructed to call or message via MyChart if he/she has any questions or concerns regarding our treatment plan. No barriers to understanding were identified. We discussed Red Flag symptoms and signs in detail. Patient expressed understanding regarding what to do in case of urgent or emergency type symptoms.   Medication list was reconciled, printed and provided to the patient in AVS. Patient instructions and summary information was reviewed with the patient as documented in the AVS. This note was prepared with assistance of Dragon voice recognition software. Occasional wrong-word or sound-a-like substitutions may have occurred due to the inherent limitations  of voice recognition software  This visit occurred during the SARS-CoV-2 public health emergency.  Safety protocols were in place, including screening questions prior to the visit, additional usage of staff PPE, and extensive cleaning of exam room while observing appropriate contact time as indicated for disinfecting solutions.

## 2020-03-01 ENCOUNTER — Other Ambulatory Visit: Payer: Self-pay | Admitting: Family Medicine

## 2020-03-19 ENCOUNTER — Encounter: Payer: Self-pay | Admitting: Family Medicine

## 2020-03-19 ENCOUNTER — Other Ambulatory Visit: Payer: Self-pay

## 2020-03-19 ENCOUNTER — Ambulatory Visit (INDEPENDENT_AMBULATORY_CARE_PROVIDER_SITE_OTHER): Payer: Medicare Other | Admitting: Family Medicine

## 2020-03-19 VITALS — BP 110/70 | HR 73 | Temp 98.3°F | Wt 214.8 lb

## 2020-03-19 DIAGNOSIS — M170 Bilateral primary osteoarthritis of knee: Secondary | ICD-10-CM | POA: Diagnosis not present

## 2020-03-19 DIAGNOSIS — E782 Mixed hyperlipidemia: Secondary | ICD-10-CM | POA: Diagnosis not present

## 2020-03-19 DIAGNOSIS — Z1231 Encounter for screening mammogram for malignant neoplasm of breast: Secondary | ICD-10-CM

## 2020-03-19 DIAGNOSIS — Z853 Personal history of malignant neoplasm of breast: Secondary | ICD-10-CM | POA: Diagnosis not present

## 2020-03-19 DIAGNOSIS — M810 Age-related osteoporosis without current pathological fracture: Secondary | ICD-10-CM

## 2020-03-19 LAB — CBC WITH DIFFERENTIAL/PLATELET
Basophils Absolute: 0.1 10*3/uL (ref 0.0–0.1)
Basophils Relative: 1.3 % (ref 0.0–3.0)
Eosinophils Absolute: 0.1 10*3/uL (ref 0.0–0.7)
Eosinophils Relative: 1.2 % (ref 0.0–5.0)
HCT: 38.4 % (ref 36.0–46.0)
Hemoglobin: 13 g/dL (ref 12.0–15.0)
Lymphocytes Relative: 37.9 % (ref 12.0–46.0)
Lymphs Abs: 2 10*3/uL (ref 0.7–4.0)
MCHC: 33.8 g/dL (ref 30.0–36.0)
MCV: 88.3 fl (ref 78.0–100.0)
Monocytes Absolute: 0.4 10*3/uL (ref 0.1–1.0)
Monocytes Relative: 7.5 % (ref 3.0–12.0)
Neutro Abs: 2.8 10*3/uL (ref 1.4–7.7)
Neutrophils Relative %: 52.1 % (ref 43.0–77.0)
Platelets: 186 10*3/uL (ref 150.0–400.0)
RBC: 4.34 Mil/uL (ref 3.87–5.11)
RDW: 14.1 % (ref 11.5–15.5)
WBC: 5.3 10*3/uL (ref 4.0–10.5)

## 2020-03-19 LAB — COMPREHENSIVE METABOLIC PANEL
ALT: 26 U/L (ref 0–35)
AST: 20 U/L (ref 0–37)
Albumin: 4.3 g/dL (ref 3.5–5.2)
Alkaline Phosphatase: 67 U/L (ref 39–117)
BUN: 15 mg/dL (ref 6–23)
CO2: 27 mEq/L (ref 19–32)
Calcium: 9.1 mg/dL (ref 8.4–10.5)
Chloride: 106 mEq/L (ref 96–112)
Creatinine, Ser: 0.62 mg/dL (ref 0.40–1.20)
GFR: 95.07 mL/min (ref >60.00)
Glucose, Bld: 88 mg/dL (ref 70–99)
Potassium: 4 mEq/L (ref 3.5–5.1)
Sodium: 139 mEq/L (ref 135–145)
Total Bilirubin: 0.5 mg/dL (ref 0.2–1.2)
Total Protein: 7 g/dL (ref 6.0–8.3)

## 2020-03-19 LAB — TSH: TSH: 1.18 u[IU]/mL (ref 0.35–4.50)

## 2020-03-19 LAB — LIPID PANEL
Cholesterol: 203 mg/dL — ABNORMAL HIGH (ref 0–200)
HDL: 77.5 mg/dL (ref >39.00)
LDL Cholesterol: 101 mg/dL — ABNORMAL HIGH (ref 0–99)
NonHDL: 125.22
Total CHOL/HDL Ratio: 3
Triglycerides: 121 mg/dL (ref 0.0–149.0)
VLDL: 24.2 mg/dL (ref 0.0–40.0)

## 2020-03-19 NOTE — Progress Notes (Signed)
Subjective  Chief Complaint  Patient presents with  . Annual Exam    HPI: Susan Beard is a 69 y.o. female who presents to [facility] today for a Female Wellness Visit. She also has the concerns and/or needs as listed above in the chief complaint. These will be addressed in addition to the Health Maintenance Visit.   Wellness Visit: annual visit with health maintenance review and exam without Pap   HM: due for mammo and dexa in July. Doing well. ROS + ankle edema, dependent. No new concerns. Chronic disease f/u and/or acute problem visit: (deemed necessary to be done in addition to the wellness visit):  HLD: fasting for labs. On statin. No Myalgias or SEs  OA: knees and hands stable.   Assessment  1. Mixed hyperlipidemia   2. Age-related osteoporosis without current pathological fracture   3. Primary osteoarthritis of knees, bilateral   4. History of right breast cancer   5. Encounter for screening mammogram for breast cancer      Plan  Female Wellness Visit:  Age appropriate Health Maintenance and Prevention measures were discussed with patient. Included topics are cancer screening recommendations, ways to keep healthy (see AVS) including dietary and exercise recommendations, regular eye and dental care, use of seat belts, and avoidance of moderate alcohol use and tobacco use.   BMI: discussed patient's BMI and encouraged positive lifestyle modifications to help get to or maintain a target BMI.  HM needs and immunizations were addressed and ordered. See below for orders. See HM and immunization section for updates.  Routine labs and screening tests ordered including cmp, cbc and lipids where appropriate.  Discussed recommendations regarding Vit D and calcium supplementation (see AVS)  Chronic disease management visit and/or acute problem visit:  HLD: recheck fasting labs on statin.  dexa to recheck osteoporosis on fosamax x 2 years.    OA: stable.   Follow up: Return in about 1 year (around 03/19/2021) for complete physical.  Orders Placed This Encounter  Procedures  . MM DIGITAL SCREENING BILATERAL  . DG Bone Density  . CBC with Differential/Platelet  . Comprehensive metabolic panel  . Lipid panel  . TSH   No orders of the defined types were placed in this encounter.     Lifestyle: Body mass index is 31.72 kg/m. Wt Readings from Last 3 Encounters:  03/19/20 214 lb 12.8 oz (97.4 kg)  02/28/20 214 lb 9.6 oz (97.3 kg)  02/21/20 214 lb 9.6 oz (97.3 kg)    Patient Active Problem List   Diagnosis Date Noted  . Chronic pain of left knee 03/13/2019  . Primary osteoarthritis of knees, bilateral 03/13/2019    Right knee responded to steroid injection 2019; left knee with djd changes in medial and patellofemoral areas on xrays 07/2018. No response to steroid injection at that time.  SM referral 03/2019   . Mixed hyperlipidemia 04/25/2018  . Medicare annual wellness visit, subsequent 01/20/2018    Declined AWV 2019   . Age-related osteoporosis without current pathological fracture 04/22/2016    DEXA j7/2019: stable osteoporosis; continue fosamax. DEXA scan July 2017: Hip T equals -2.8, spine equals -2.5; discussed osteoporosis medications and started fosamax weekly   . Colon cancer screening 11/26/2015  . History of right breast cancer 10/01/2015    Modified radical mastectomy with reconstruction   . Primary osteoarthritis of both hands 10/01/2015   Health Maintenance  Topic Date Due  . Samul Dada  09/30/2018  . DEXA  SCAN  04/21/2020  . MAMMOGRAM  05/02/2020  . INFLUENZA VACCINE  05/05/2020  . Fecal DNA (Cologuard)  04/11/2022  . COVID-19 Vaccine  Completed  . Hepatitis C Screening  Completed  . PNA vac Low Risk Adult  Completed   Immunization History  Administered Date(s) Administered  . Influenza, High Dose Seasonal PF 06/16/2018, 06/23/2019  . PFIZER SARS-COV-2 Vaccination 12/10/2019,  01/10/2020  . Pneumococcal Conjugate-13 10/16/2015  . Pneumococcal Polysaccharide-23 12/18/2016  . Tdap 09/30/2008  . Zoster 09/30/2005  . Zoster Recombinat (Shingrix) 06/16/2018, 11/11/2018   We updated and reviewed the patient's past history in detail and it is documented below. Allergies: Patient is allergic to codeine. Past Medical History Patient  has a past medical history of Age-related osteoporosis without current pathological fracture, Arthritis, Breast cancer ([place]) (1989), and Primary osteoarthritis of knees, bilateral (03/13/2019). Past Surgical History Patient  has a past surgical history that includes Mastectomy modified radical (Right, 1989) and Breast reconstruction (Right). Family History: Patient family history includes Arthritis in her mother; Brain cancer in her father; Diabetes in her daughter; Healthy in her sister and son; Hyperlipidemia in her father and mother; Osteoporosis in her mother; Polymyalgia rheumatica in her mother. Social History:  Patient  reports that she has never smoked. She has never used smokeless tobacco. She reports that she does not drink alcohol and does not use drugs.  Review of Systems: Constitutional: negative for fever or malaise Ophthalmic: negative for photophobia, double vision or loss of vision Cardiovascular: negative for chest pain, dyspnea on exertion, or new LE swelling Respiratory: negative for SOB or persistent cough Gastrointestinal: negative for abdominal pain, change in bowel habits or melena Genitourinary: negative for dysuria or gross hematuria, no abnormal uterine bleeding or disharge Musculoskeletal: negative for new gait disturbance or muscular weakness Integumentary: negative for new or persistent rashes, no breast lumps Neurological: negative for TIA or stroke symptoms Psychiatric: negative for SI or delusions Allergic/Immunologic: negative for hives  Patient Care Team    Relationship Specialty Notifications Start End   Leamon Arnt, MD PCP - General Family Medicine  01/20/18   Eunice Blase, MD Consulting Physician Sports Medicine  10/03/19     Objective  Vitals: BP 110/70   Pulse 73   Temp 98.3 F (36.8 C) (Temporal)   Wt 214 lb 12.8 oz (97.4 kg)   SpO2 96%   BMI 31.72 kg/m  General:  Well developed, well nourished, no acute distress  Psych:  Alert and orientedx3,normal mood and affect HEENT:  Normocephalic, atraumatic, non-icteric sclera,  supple neck without adenopathy, mass or thyromegaly Cardiovascular:  Normal S1, S2, RRR without gallop, rub or murmur Respiratory:  Good breath sounds bilaterally, CTAB with normal respiratory effort Gastrointestinal: normal bowel sounds, soft, non-tender, no noted masses. No HSM MSK: + OA changes in hands, contusions. Joints are without erythema or swelling.  Skin:  Warm, no rashes or suspicious lesions noted, mult seb k's and sun related skin changes Neurologic:    Mental status is normal. CN 2-11 are normal. Gross motor and sensory exams are normal. Normal gait. No tremor Breast Exam: No mass, skin retraction or nipple discharge is appreciated in either breast. Right reconstructive changes. No axillary adenopathy. Fibrocystic changes are not noted    Commons side effects, risks, benefits, and alternatives for medications and treatment plan prescribed today were discussed, and the patient expressed understanding of the given instructions. Patient is instructed to call or message via MyChart if he/she has any questions or concerns regarding  our treatment plan. No barriers to understanding were identified. We discussed Red Flag symptoms and signs in detail. Patient expressed understanding regarding what to do in case of urgent or emergency type symptoms.   Medication list was reconciled, printed and provided to the patient in AVS. Patient instructions and summary information was reviewed with the patient as documented in the AVS. This note was prepared with  assistance of Dragon voice recognition software. Occasional wrong-word or sound-a-like substitutions may have occurred due to the inherent limitations of voice recognition software  This visit occurred during the SARS-CoV-2 public health emergency.  Safety protocols were in place, including screening questions prior to the visit, additional usage of staff PPE, and extensive cleaning of exam room while observing appropriate contact time as indicated for disinfecting solutions.

## 2020-03-19 NOTE — Patient Instructions (Addendum)
Please return in 12 months for your annual complete physical; please come fasting.  I will release your lab results to you on your MyChart account with further instructions. Please reply with any questions.   If you have any questions or concerns, please don't hesitate to send me a message via MyChart or call the office at [phone]. Thank you for visiting with [place] today! It's our pleasure caring for you.  I have ordered a mammogram and/or bone density for you as we discussed today: [x]   Mammogram  [x]   Bone Density  Please call the office checked below to schedule your appointment: 6 months for hypertension follow up. Your appointment will at the following location  []   [facility]       [place], [place]         [x]   [facility]  [address] [place], [place]  Your last Tdap was done in 2009.

## 2020-05-14 DIAGNOSIS — M8589 Other specified disorders of bone density and structure, multiple sites: Secondary | ICD-10-CM | POA: Diagnosis not present

## 2020-05-14 DIAGNOSIS — Z1231 Encounter for screening mammogram for malignant neoplasm of breast: Secondary | ICD-10-CM | POA: Diagnosis not present

## 2020-05-14 LAB — HM MAMMOGRAPHY

## 2020-05-14 LAB — HM DEXA SCAN

## 2020-05-30 ENCOUNTER — Encounter: Payer: Self-pay | Admitting: Family Medicine

## 2020-06-04 ENCOUNTER — Encounter: Payer: Self-pay | Admitting: Family Medicine

## 2020-06-05 ENCOUNTER — Encounter: Payer: Self-pay | Admitting: Family Medicine

## 2020-06-11 ENCOUNTER — Encounter: Payer: Self-pay | Admitting: Family Medicine

## 2020-06-11 ENCOUNTER — Ambulatory Visit (INDEPENDENT_AMBULATORY_CARE_PROVIDER_SITE_OTHER): Payer: Medicare Other | Admitting: Family Medicine

## 2020-06-11 ENCOUNTER — Other Ambulatory Visit: Payer: Self-pay

## 2020-06-11 VITALS — BP 132/68 | HR 82 | Temp 97.4°F | Resp 16 | Ht 69.0 in | Wt 214.0 lb

## 2020-06-11 DIAGNOSIS — Z23 Encounter for immunization: Secondary | ICD-10-CM | POA: Diagnosis not present

## 2020-06-11 DIAGNOSIS — L820 Inflamed seborrheic keratosis: Secondary | ICD-10-CM

## 2020-06-11 NOTE — Progress Notes (Signed)
Subjective  CC:  Chief Complaint  Patient presents with  . Growth on back    x 1 mo feels like increased in size but not sure     HPI: Susan Beard is a 69 y.o. female who presents to the office today to address the problems listed above in the chief complaint.  Patient complains of rapidly growing lesion on her left upper back.  It is irritated at times, at times bleeds and becomes sore.  She is concerned because it grew rapidly.  No noted drainage.  It is difficult for her to see.  No systemic symptoms. Assessment  1. Sebaceous cyst      Plan   Back lesion of undetermined significance: Status post shave biopsy.  Await pathology.  Could be rapidly growing sebaceous cyst but will await pathology.  Routine wound care discussed with patient.  No complications  Follow up: As needed Visit date not found  No orders of the defined types were placed in this encounter.  No orders of the defined types were placed in this encounter.     I reviewed the patients updated PMH, FH, and SocHx.    Patient Active Problem List   Diagnosis Date Noted  . Chronic pain of left knee 03/13/2019  . Primary osteoarthritis of knees, bilateral 03/13/2019  . Mixed hyperlipidemia 04/25/2018  . Medicare annual wellness visit, subsequent 01/20/2018  . Age-related osteoporosis without current pathological fracture 04/22/2016  . Colon cancer screening 11/26/2015  . History of right breast cancer 10/01/2015  . Primary osteoarthritis of both hands 10/01/2015   Current Meds  Medication Sig  . alendronate (FOSAMAX) 70 MG tablet TAKE 1 TABLET BY MOUTH ONE TIME PER WEEK  . diphenhydrAMINE (BENADRYL) 50 MG tablet Take 50 mg by mouth at bedtime as needed for itching.  . Multiple Vitamin (MULITIVITAMIN WITH MINERALS) TABS Take 1 tablet by mouth daily.  . simvastatin (ZOCOR) 10 MG tablet TAKE 1 TABLET BY MOUTH EVERYDAY AT BEDTIME    Allergies: Patient is allergic to codeine. Family History: Patient  family history includes Arthritis in her mother; Brain cancer in her father; Diabetes in her daughter; Healthy in her sister and son; Hyperlipidemia in her father and mother; Osteoporosis in her mother; Polymyalgia rheumatica in her mother. Social History:  Patient  reports that she has never smoked. She has never used smokeless tobacco. She reports that she does not drink alcohol and does not use drugs.  Review of Systems: Constitutional: Negative for fever malaise or anorexia Cardiovascular: negative for chest pain Respiratory: negative for SOB or persistent cough Gastrointestinal: negative for abdominal pain  Objective  Vitals: BP 132/68   Pulse 82   Temp (!) 97.4 F (36.3 C) (Temporal)   Resp 16   Ht 5\' 9"  (1.753 m)   Wt 214 lb (97.1 kg)   SpO2 98%   BMI 31.60 kg/m  General: no acute distress , A&Ox3 Right upper back with 1 cm x 0.4 cm raised papillary lesions with red borders, no fluctuance or drainage.  No warmth.  Mildly tender.  Shave Biopsy Procedure Note  Pre-operative Diagnosis: Suspicious lesion, possible Seb K but rapid growth and irritated  Post-operative Diagnosis: same  Locations: Right upper back   Indications: Pain and irritation, rule out atypical changes  Anesthesia: Lidocaine 1% with epinephrine   Procedure Details   Patient informed of the risks (including bleeding and infection) and benefits of the  procedure and Verbal informed consent obtained.  The lesion and  surrounding area were given a sterile prep using alcohol. A derma blade was used to shave an area of skin approximately 1cm by 4cm.  Hemostasis achieved with alumuninum chloride. Antibiotic ointment and a sterile dressing applied.  The specimen was sent for pathologic examination. The patient tolerated the procedure well.  EBL: 0 ml  Condition: Stable  Complications: none.      Commons side effects, risks, benefits, and alternatives for medications and treatment plan prescribed  today were discussed, and the patient expressed understanding of the given instructions. Patient is instructed to call or message via MyChart if he/she has any questions or concerns regarding our treatment plan. No barriers to understanding were identified. We discussed Red Flag symptoms and signs in detail. Patient expressed understanding regarding what to do in case of urgent or emergency type symptoms.   Medication list was reconciled, printed and provided to the patient in AVS. Patient instructions and summary information was reviewed with the patient as documented in the AVS. This note was prepared with assistance of Dragon voice recognition software. Occasional wrong-word or sound-a-like substitutions may have occurred due to the inherent limitations of voice recognition software  This visit occurred during the SARS-CoV-2 public health emergency.  Safety protocols were in place, including screening questions prior to the visit, additional usage of staff PPE, and extensive cleaning of exam room while observing appropriate contact time as indicated for disinfecting solutions.

## 2020-06-11 NOTE — Patient Instructions (Signed)
Please follow up if symptoms do not improve or as needed.   Please keep area dry for 24 hours. Then can use soap and water if needed.  Keep covered during the day. Should heal in 7-10 days.

## 2020-06-11 NOTE — Addendum Note (Signed)
Addended by: Francella Solian on: 06/11/2020 10:13 AM   Modules accepted: Orders

## 2020-06-18 ENCOUNTER — Encounter: Payer: Self-pay | Admitting: Family Medicine

## 2020-10-21 ENCOUNTER — Ambulatory Visit: Payer: Medicare Other

## 2020-10-24 ENCOUNTER — Ambulatory Visit (INDEPENDENT_AMBULATORY_CARE_PROVIDER_SITE_OTHER): Payer: Medicare Other

## 2020-10-24 DIAGNOSIS — Z0000 Encounter for general adult medical examination without abnormal findings: Secondary | ICD-10-CM

## 2020-10-24 NOTE — Progress Notes (Signed)
Virtual Visit via Telephone Note  I connected with  Susan Beard on 10/24/20 at  8:00 AM EST by telephone and verified that I am speaking with the correct person using two identifiers.  Medicare Annual Wellness visit completed telephonically due to Covid-19 pandemic.   Persons participating in this call: This Health Coach and this patient.   Location: Patient: Home Provider: Office   I discussed the limitations, risks, security and privacy concerns of performing an evaluation and management service by telephone and the availability of in person appointments. The patient expressed understanding and agreed to proceed.  Unable to perform video visit due to video visit attempted and failed and/or patient does not have video capability.   Some vital signs may be absent or patient reported.   Willette Brace, LPN    Subjective:   Susan Beard is a 70 y.o. female who presents for Medicare Annual (Subsequent) preventive examination.  Review of Systems     Cardiac Risk Factors include: advanced age (>68men, >25 women);dyslipidemia;obesity (BMI >30kg/m2)     Objective:    There were no vitals filed for this visit. There is no height or weight on file to calculate BMI.  Advanced Directives 10/24/2020 10/03/2019 09/02/2016  Does Patient Have a Medical Advance Directive? Yes Yes No  Type of Paramedic of [place];Living will Living will;Healthcare Power of Attorney -  Does patient want to make changes to medical advance directive? - No - Patient declined -  Copy of [place] in Chart? No - copy requested No - copy requested -  Would patient like information on creating a medical advance directive? - - No - Patient declined    Current Medications (verified) Outpatient Encounter Medications as of 10/24/2020  Medication Sig  . alendronate (FOSAMAX) 70 MG tablet TAKE 1 TABLET BY MOUTH ONE TIME PER WEEK  . diphenhydrAMINE (BENADRYL) 50 MG  tablet Take 50 mg by mouth at bedtime as needed for itching.  . Multiple Vitamin (MULITIVITAMIN WITH MINERALS) TABS Take 1 tablet by mouth daily.  . simvastatin (ZOCOR) 10 MG tablet TAKE 1 TABLET BY MOUTH EVERYDAY AT BEDTIME   No facility-administered encounter medications on file as of 10/24/2020.    Allergies (verified) Codeine   History: Past Medical History:  Diagnosis Date  . Age-related osteoporosis without current pathological fracture    Dexa Scan July 2017  . Arthritis   . Breast cancer ([place]) 1989   right  . Primary osteoarthritis of knees, bilateral 03/13/2019   Past Surgical History:  Procedure Laterality Date  . BREAST RECONSTRUCTION Right   . MASTECTOMY MODIFIED RADICAL Right 1989   Family History  Problem Relation Age of Onset  . Arthritis Mother   . Hyperlipidemia Mother   . Osteoporosis Mother   . Polymyalgia rheumatica Mother   . Brain cancer Father   . Hyperlipidemia Father   . Healthy Sister   . Diabetes Daughter   . Healthy Son    Social History   Socioeconomic History  . Marital status: Married    Spouse name: Not on file  . Number of children: 2  . Years of education: Not on file  . Highest education level: Not on file  Occupational History  . Occupation: Licensed Animator: UNKNOWN  Tobacco Use  . Smoking status: Never Smoker  . Smokeless tobacco: Never Used  Vaping Use  . Vaping Use: Never used  Substance and Sexual Activity  . Alcohol  use: No  . Drug use: No  . Sexual activity: Yes    Birth control/protection: Post-menopausal  Other Topics Concern  . Not on file  Social History Narrative   14 steps in home    Social Determinants of Health   Financial Resource Strain: Low Risk   . Difficulty of Paying Living Expenses: Not hard at all  Food Insecurity: No Food Insecurity  . Worried About Charity fundraiser in the Last Year: Never true  . Ran Out of Food in the Last Year: Never true  Transportation Needs: No  Transportation Needs  . Lack of Transportation (Medical): No  . Lack of Transportation (Non-Medical): No  Physical Activity: Inactive  . Days of Exercise per Week: 0 days  . Minutes of Exercise per Session: 0 min  Stress: No Stress Concern Present  . Feeling of Stress : Not at all  Social Connections: Moderately Integrated  . Frequency of Communication with Friends and Family: More than three times a week  . Frequency of Social Gatherings with Friends and Family: More than three times a week  . Attends Religious Services: More than 4 times per year  . Active Member of Clubs or Organizations: No  . Attends Archivist Meetings: Never  . Marital Status: Married    Tobacco Counseling Counseling given: Not Answered   Clinical Intake:  Pre-visit preparation completed: Yes  Pain : No/denies pain     BMI - recorded: 31.6 Nutritional Status: BMI > 30  Obese Nutritional Risks: None Diabetes: No  How often do you need to have someone help you when you read instructions, pamphlets, or other written materials from your doctor or pharmacy?: 1 - Never  Diabetic?No  Interpreter Needed?: No  Information entered by :: Charlott Rakes, LPN   Activities of Daily Living In your present state of health, do you have any difficulty performing the following activities: 10/24/2020  Hearing? N  Vision? N  Difficulty concentrating or making decisions? N  Walking or climbing stairs? N  Dressing or bathing? N  Doing errands, shopping? N  Preparing Food and eating ? N  Using the Toilet? N  In the past six months, have you accidently leaked urine? N  Do you have problems with loss of bowel control? N  Managing your Medications? N  Managing your Finances? N  Housekeeping or managing your Housekeeping? N  Some recent data might be hidden    Patient Care Team: Leamon Arnt, MD as PCP - General (Family Medicine) Junius Roads, Legrand Como, MD as Consulting Physician (Sports  Medicine)  Indicate any recent Medical Services you may have received from other than [facility] providers in the past year (date may be approximate).     Assessment:   This is a routine wellness examination for Susan Beard.  Hearing/Vision screen  Hearing Screening   125Hz  250Hz  500Hz  1000Hz  2000Hz  3000Hz  4000Hz  6000Hz  8000Hz   Right ear:           Left ear:           Comments: Pt denies nay hearing issues   Vision Screening Comments: Pt follows up with Dr Dorena Cookey for annual eye exams  Dietary issues and exercise activities discussed:    Goals    . Patient Stated     Lose weight      Depression Screen PHQ 2/9 Scores 10/24/2020 10/03/2019 03/13/2019 01/20/2018  PHQ - 2 Score 0 0 0 0    Fall Risk Fall Risk  10/24/2020 10/03/2019  03/13/2019 01/20/2018  Falls in the past year? 1 0 0 No  Number falls in past yr: 1 - 0 -  Injury with Fall? 1 0 0 -  Comment cracked tailbone - - -  Risk for fall due to : Impaired vision;History of fall(s) - - -  Risk for fall due to: Comment slipped on ice - - -  Follow up Falls prevention discussed Falls evaluation completed;Education provided;Falls prevention discussed Falls evaluation completed -    FALL RISK PREVENTION PERTAINING TO THE HOME:  Any stairs in or around the home? Yes  If so, are there any without handrails? No  Home free of loose throw rugs in walkways, pet beds, electrical cords, etc? Yes  Adequate lighting in your home to reduce risk of falls? Yes   ASSISTIVE DEVICES UTILIZED TO PREVENT FALLS:  Life alert? No  Use of a cane, walker or w/c? No  Grab bars in the bathroom? No  Shower chair or bench in shower? No  Elevated toilet seat or a handicapped toilet? No   TIMED UP AND GO:  Was the test performed? No .     Cognitive Function:     6CIT Screen 10/24/2020  What Year? 0 points  What month? 0 points  Count back from 20 0 points  Months in reverse 0 points  Repeat phrase 0 points    Immunizations Immunization History   Administered Date(s) Administered  . Fluad Quad(high Dose 65+) 06/11/2020  . Influenza, High Dose Seasonal PF 06/16/2018, 06/23/2019  . PFIZER(Purple Top)SARS-COV-2 Vaccination 12/10/2019, 01/10/2020  . Pneumococcal Conjugate-13 10/16/2015  . Pneumococcal Polysaccharide-23 12/18/2016  . Tdap 09/30/2008  . Zoster 09/30/2005  . Zoster Recombinat (Shingrix) 06/16/2018, 11/11/2018    TDAP status: Due, Education has been provided regarding the importance of this vaccine. Advised may receive this vaccine at local pharmacy or Health Dept. Aware to provide a copy of the vaccination record if obtained from local pharmacy or Health Dept. Verbalized acceptance and understanding.  Flu Vaccine status: Up to date done 06/11/20  Pneumococcal vaccine status: Up to date  Covid-19 vaccine status: Completed vaccines  Qualifies for Shingles Vaccine? Yes   Zostavax completed Yes   Shingrix Completed?: Yes  Screening Tests Health Maintenance  Topic Date Due  . COVID-19 Vaccine (3 - Pfizer risk 4-dose series) 11/09/2020 (Originally 02/07/2020)  . TETANUS/TDAP  10/24/2021 (Originally 09/30/2018)  . MAMMOGRAM  05/14/2021  . Fecal DNA (Cologuard)  04/11/2022  . DEXA SCAN  05/14/2022  . INFLUENZA VACCINE  Completed  . Hepatitis C Screening  Completed  . PNA vac Low Risk Adult  Completed    Health Maintenance  There are no preventive care reminders to display for this patient.  Colorectal cancer screening: Type of screening: Cologuard. Completed 04/12/19. Repeat every 3 years  Mammogram status: Completed 05/14/20. Repeat every year  Bone Density status: Completed 05/14/20. Results reflect: Bone density results: OSTEOPOROSIS. Repeat every 2 years.    Additional Screening:  Hepatitis C Screening: Completed 01/20/18  Vision Screening: Recommended annual ophthalmology exams for early detection of glaucoma and other disorders of the eye. Is the patient up to date with their annual eye exam?  Yes  Who  is the provider or what is the name of the office in which the patient attends annual eye exams? Dr Dorena Cookey   Dental Screening: Recommended annual dental exams for proper oral hygiene  Community Resource Referral / Chronic Care Management: CRR required this visit?  No   CCM required  this visit?  No      Plan:     I have personally reviewed and noted the following in the patient's chart:   . Medical and social history . Use of alcohol, tobacco or illicit drugs  . Current medications and supplements . Functional ability and status . Nutritional status . Physical activity . Advanced directives . List of other physicians . Hospitalizations, surgeries, and ER visits in previous 12 months . Vitals . Screenings to include cognitive, depression, and falls . Referrals and appointments  In addition, I have reviewed and discussed with patient certain preventive protocols, quality metrics, and best practice recommendations. A written personalized care plan for preventive services as well as general preventive health recommendations were provided to patient.     Willette Brace, LPN   [id number]   Nurse Notes: None

## 2020-10-24 NOTE — Patient Instructions (Addendum)
[name] , Thank you for taking time to come for your Medicare Wellness Visit. I appreciate your ongoing commitment to your health goals. Please review the following plan we discussed and let me know if I can assist you in the future.   Screening recommendations/referrals: Colonoscopy: Done 04/12/19 Cologuard Mammogram: Done 05/14/20 Bone Density: Done 05/14/20 Recommended yearly ophthalmology/optometry visit for glaucoma screening and checkup Recommended yearly dental visit for hygiene and checkup  Vaccinations: Influenza vaccine: Done 06/11/20 Up to date Pneumococcal vaccine: Up to date Tdap vaccine: Due and discussed Shingles vaccine: Completed 06/16/18 & 11/11/18   Covid-19:Completed 12/10/19 & 01/10/20  Advanced directives: Please bring a copy of your health care power of attorney and living will to the office at your convenience.  Conditions/risks identified: Lose weight   Next appointment: Follow up in one year for your annual wellness visit    Preventive Care 65 Years and Older, Female Preventive care refers to lifestyle choices and visits with your health care provider that can promote health and wellness. What does preventive care include?  A yearly physical exam. This is also called an annual well check.  Dental exams once or twice a year.  Routine eye exams. Ask your health care provider how often you should have your eyes checked.  Personal lifestyle choices, including:  Daily care of your teeth and gums.  Regular physical activity.  Eating a healthy diet.  Avoiding tobacco and drug use.  Limiting alcohol use.  Practicing safe sex.  Taking low-dose aspirin every day.  Taking vitamin and mineral supplements as recommended by your health care provider. What happens during an annual well check? The services and screenings done by your health care provider during your annual well check will depend on your age, overall health, lifestyle risk factors, and family  history of disease. Counseling  Your health care provider may ask you questions about your:  Alcohol use.  Tobacco use.  Drug use.  Emotional well-being.  Home and relationship well-being.  Sexual activity.  Eating habits.  History of falls.  Memory and ability to understand (cognition).  Work and work Statistician.  Reproductive health. Screening  You may have the following tests or measurements:  Height, weight, and BMI.  Blood pressure.  Lipid and cholesterol levels. These may be checked every 5 years, or more frequently if you are over 70 years old.  Skin check.  Lung cancer screening. You may have this screening every year starting at age 84 if you have a 30-pack-year history of smoking and currently smoke or have quit within the past 15 years.  Fecal occult blood test (FOBT) of the stool. You may have this test every year starting at age 89.  Flexible sigmoidoscopy or colonoscopy. You may have a sigmoidoscopy every 5 years or a colonoscopy every 10 years starting at age 55.  Hepatitis C blood test.  Hepatitis B blood test.  Sexually transmitted disease (STD) testing.  Diabetes screening. This is done by checking your blood sugar (glucose) after you have not eaten for a while (fasting). You may have this done every 1-3 years.  Bone density scan. This is done to screen for osteoporosis. You may have this done starting at age 58.  Mammogram. This may be done every 1-2 years. Talk to your health care provider about how often you should have regular mammograms. Talk with your health care provider about your test results, treatment options, and if necessary, the need for more tests. Vaccines  Your health care  provider may recommend certain vaccines, such as:  Influenza vaccine. This is recommended every year.  Tetanus, diphtheria, and acellular pertussis (Tdap, Td) vaccine. You may need a Td booster every 10 years.  Zoster vaccine. You may need this after  age 26.  Pneumococcal 13-valent conjugate (PCV13) vaccine. One dose is recommended after age 54.  Pneumococcal polysaccharide (PPSV23) vaccine. One dose is recommended after age 72. Talk to your health care provider about which screenings and vaccines you need and how often you need them. This information is not intended to replace advice given to you by your health care provider. Make sure you discuss any questions you have with your health care provider. Document Released: 10/18/2015 Document Revised: 06/10/2016 Document Reviewed: 07/23/2015 Elsevier Interactive Patient Education  2017 [place] Prevention in the Home Falls can cause injuries. They can happen to people of all ages. There are many things you can do to make your home safe and to help prevent falls. What can I do on the outside of my home?  Regularly fix the edges of walkways and driveways and fix any cracks.  Remove anything that might make you trip as you walk through a door, such as a raised step or threshold.  Trim any bushes or trees on the path to your home.  Use bright outdoor lighting.  Clear any walking paths of anything that might make someone trip, such as rocks or tools.  Regularly check to see if handrails are loose or broken. Make sure that both sides of any steps have handrails.  Any raised decks and porches should have guardrails on the edges.  Have any leaves, snow, or ice cleared regularly.  Use sand or salt on walking paths during winter.  Clean up any spills in your garage right away. This includes oil or grease spills. What can I do in the bathroom?  Use night lights.  Install grab bars by the toilet and in the tub and shower. Do not use towel bars as grab bars.  Use non-skid mats or decals in the tub or shower.  If you need to sit down in the shower, use a plastic, non-slip stool.  Keep the floor dry. Clean up any water that spills on the floor as soon as it  happens.  Remove soap buildup in the tub or shower regularly.  Attach bath mats securely with double-sided non-slip rug tape.  Do not have throw rugs and other things on the floor that can make you trip. What can I do in the bedroom?  Use night lights.  Make sure that you have a light by your bed that is easy to reach.  Do not use any sheets or blankets that are too big for your bed. They should not hang down onto the floor.  Have a firm chair that has side arms. You can use this for support while you get dressed.  Do not have throw rugs and other things on the floor that can make you trip. What can I do in the kitchen?  Clean up any spills right away.  Avoid walking on wet floors.  Keep items that you use a lot in easy-to-reach places.  If you need to reach something above you, use a strong step stool that has a grab bar.  Keep electrical cords out of the way.  Do not use floor polish or wax that makes floors slippery. If you must use wax, use non-skid floor wax.  Do not have throw  rugs and other things on the floor that can make you trip. What can I do with my stairs?  Do not leave any items on the stairs.  Make sure that there are handrails on both sides of the stairs and use them. Fix handrails that are broken or loose. Make sure that handrails are as long as the stairways.  Check any carpeting to make sure that it is firmly attached to the stairs. Fix any carpet that is loose or worn.  Avoid having throw rugs at the top or bottom of the stairs. If you do have throw rugs, attach them to the floor with carpet tape.  Make sure that you have a light switch at the top of the stairs and the bottom of the stairs. If you do not have them, ask someone to add them for you. What else can I do to help prevent falls?  Wear shoes that:  Do not have high heels.  Have rubber bottoms.  Are comfortable and fit you well.  Are closed at the toe. Do not wear sandals.  If you  use a stepladder:  Make sure that it is fully opened. Do not climb a closed stepladder.  Make sure that both sides of the stepladder are locked into place.  Ask someone to hold it for you, if possible.  Clearly mark and make sure that you can see:  Any grab bars or handrails.  First and last steps.  Where the edge of each step is.  Use tools that help you move around (mobility aids) if they are needed. These include:  Canes.  Walkers.  Scooters.  Crutches.  Turn on the lights when you go into a dark area. Replace any light bulbs as soon as they burn out.  Set up your furniture so you have a clear path. Avoid moving your furniture around.  If any of your floors are uneven, fix them.  If there are any pets around you, be aware of where they are.  Review your medicines with your doctor. Some medicines can make you feel dizzy. This can increase your chance of falling. Ask your doctor what other things that you can do to help prevent falls. This information is not intended to replace advice given to you by your health care provider. Make sure you discuss any questions you have with your health care provider. Document Released: 07/18/2009 Document Revised: 02/27/2016 Document Reviewed: 10/26/2014 Elsevier Interactive Patient Education  2017 [employer].

## 2021-02-18 ENCOUNTER — Other Ambulatory Visit: Payer: Self-pay | Admitting: Family Medicine

## 2021-03-09 ENCOUNTER — Other Ambulatory Visit: Payer: Self-pay | Admitting: Family Medicine

## 2021-03-24 ENCOUNTER — Ambulatory Visit: Payer: Medicare Other | Admitting: Family Medicine

## 2021-03-26 ENCOUNTER — Ambulatory Visit (INDEPENDENT_AMBULATORY_CARE_PROVIDER_SITE_OTHER): Payer: Medicare Other

## 2021-03-26 ENCOUNTER — Other Ambulatory Visit: Payer: Self-pay

## 2021-03-26 ENCOUNTER — Encounter: Payer: Self-pay | Admitting: Family Medicine

## 2021-03-26 ENCOUNTER — Ambulatory Visit (INDEPENDENT_AMBULATORY_CARE_PROVIDER_SITE_OTHER): Payer: Medicare Other | Admitting: Family Medicine

## 2021-03-26 VITALS — BP 127/72 | HR 63 | Temp 98.2°F | Ht 69.0 in | Wt 220.6 lb

## 2021-03-26 DIAGNOSIS — M791 Myalgia, unspecified site: Secondary | ICD-10-CM | POA: Diagnosis not present

## 2021-03-26 DIAGNOSIS — M4319 Spondylolisthesis, multiple sites in spine: Secondary | ICD-10-CM | POA: Diagnosis not present

## 2021-03-26 DIAGNOSIS — Z1231 Encounter for screening mammogram for malignant neoplasm of breast: Secondary | ICD-10-CM

## 2021-03-26 DIAGNOSIS — M47816 Spondylosis without myelopathy or radiculopathy, lumbar region: Secondary | ICD-10-CM | POA: Diagnosis not present

## 2021-03-26 DIAGNOSIS — M4699 Unspecified inflammatory spondylopathy, multiple sites in spine: Secondary | ICD-10-CM | POA: Diagnosis not present

## 2021-03-26 DIAGNOSIS — M5432 Sciatica, left side: Secondary | ICD-10-CM

## 2021-03-26 DIAGNOSIS — M25562 Pain in left knee: Secondary | ICD-10-CM | POA: Diagnosis not present

## 2021-03-26 DIAGNOSIS — E782 Mixed hyperlipidemia: Secondary | ICD-10-CM | POA: Diagnosis not present

## 2021-03-26 DIAGNOSIS — M810 Age-related osteoporosis without current pathological fracture: Secondary | ICD-10-CM | POA: Diagnosis not present

## 2021-03-26 DIAGNOSIS — Z853 Personal history of malignant neoplasm of breast: Secondary | ICD-10-CM

## 2021-03-26 DIAGNOSIS — G8929 Other chronic pain: Secondary | ICD-10-CM | POA: Diagnosis not present

## 2021-03-26 DIAGNOSIS — M170 Bilateral primary osteoarthritis of knee: Secondary | ICD-10-CM | POA: Diagnosis not present

## 2021-03-26 DIAGNOSIS — M419 Scoliosis, unspecified: Secondary | ICD-10-CM | POA: Diagnosis not present

## 2021-03-26 LAB — CBC WITH DIFFERENTIAL/PLATELET
Basophils Absolute: 0.1 10*3/uL (ref 0.0–0.1)
Basophils Relative: 1.1 % (ref 0.0–3.0)
Eosinophils Absolute: 0.1 10*3/uL (ref 0.0–0.7)
Eosinophils Relative: 1 % (ref 0.0–5.0)
HCT: 38.8 % (ref 36.0–46.0)
Hemoglobin: 13.2 g/dL (ref 12.0–15.0)
Lymphocytes Relative: 38.5 % (ref 12.0–46.0)
Lymphs Abs: 2.2 10*3/uL (ref 0.7–4.0)
MCHC: 34.1 g/dL (ref 30.0–36.0)
MCV: 87.8 fl (ref 78.0–100.0)
Monocytes Absolute: 0.4 10*3/uL (ref 0.1–1.0)
Monocytes Relative: 6.8 % (ref 3.0–12.0)
Neutro Abs: 2.9 10*3/uL (ref 1.4–7.7)
Neutrophils Relative %: 52.6 % (ref 43.0–77.0)
Platelets: 205 10*3/uL (ref 150.0–400.0)
RBC: 4.41 Mil/uL (ref 3.87–5.11)
RDW: 14.5 % (ref 11.5–15.5)
WBC: 5.6 10*3/uL (ref 4.0–10.5)

## 2021-03-26 LAB — COMPREHENSIVE METABOLIC PANEL
ALT: 27 U/L (ref 0–35)
AST: 22 U/L (ref 0–37)
Albumin: 4.3 g/dL (ref 3.5–5.2)
Alkaline Phosphatase: 61 U/L (ref 39–117)
BUN: 18 mg/dL (ref 6–23)
CO2: 27 mEq/L (ref 19–32)
Calcium: 9.2 mg/dL (ref 8.4–10.5)
Chloride: 105 mEq/L (ref 96–112)
Creatinine, Ser: 0.65 mg/dL (ref 0.40–1.20)
GFR: 88.63 mL/min (ref >60.00)
Glucose, Bld: 91 mg/dL (ref 70–99)
Potassium: 4.1 mEq/L (ref 3.5–5.1)
Sodium: 139 mEq/L (ref 135–145)
Total Bilirubin: 0.5 mg/dL (ref 0.2–1.2)
Total Protein: 7.5 g/dL (ref 6.0–8.3)

## 2021-03-26 LAB — LIPID PANEL
Cholesterol: 200 mg/dL (ref 0–200)
HDL: 74.5 mg/dL (ref >39.00)
LDL Cholesterol: 99 mg/dL (ref 0–99)
NonHDL: 125.51
Total CHOL/HDL Ratio: 3
Triglycerides: 135 mg/dL (ref 0.0–149.0)
VLDL: 27 mg/dL (ref 0.0–40.0)

## 2021-03-26 LAB — CK: Total CK: 86 U/L (ref 7–177)

## 2021-03-26 MED ORDER — PREDNISONE 10 MG PO TABS: ORAL_TABLET | ORAL | 0 refills | Status: DC

## 2021-03-26 NOTE — Progress Notes (Signed)
Subjective  Chief Complaint  Patient presents with   Annual Exam    Fasting   Hyperlipidemia    HPI: Susan Beard is a 70 y.o. female who presents to [facility] today for a Female Wellness Visit. She also has the concerns and/or needs as listed above in the chief complaint. These will be addressed in addition to the Health Maintenance Visit.   Wellness Visit: annual visit with health maintenance review and exam without Pap  Health maintenance: Due for mammogram this summer.  History of breast cancer.  Colorectal cancer screening up-to-date with Cologuard.  Eye exam normal and up-to-date.  Immunizations all up-to-date.  Overall feeling well.  Chronic disease f/u and/or acute problem visit: (deemed necessary to be done in addition to the wellness visit): Chronic left knee pain due to osteoarthritis persists.  Has seen sports medicine in the past.  Has failed steroid injections and hyaluronic acid injection per sports medicine.  She reports the pain is tolerable.  Advil, Tylenol and Voltaren gel are not helpful.  No red hot swollen joints. Complains of upper extremity myalgias over the last year or so.  She is on a statin.  She is not certain if this is the cause.  Achy especially at night.  No joint pain: Shoulder elbow or wrist.  No rashes.  No weakness. Osteoporosis continues on Fosamax.  Reviewed bone density from 2021.  Mildly improved.  Tolerating Fosamax without symptoms of GERD or reflux.  No melena. Hyperlipidemia on simvastatin for the last 3 years or so.  Possibly having statin related myalgias.  Compliant with medications.  Fasting for recheck today.  This is her only cardiovascular risk factor. Complains of left sciatic pain for months to years.  Now worsening.  Reports significant left sciatic notch pain radiating down left leg with prolonged standing.  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  She denies any pain with sitting or  driving.  No weakness in the extremity.  No low back pain.  This is never been evaluated in the past.  She rarely takes Tylenol or Advil for the pain.  No problems sleeping.  No injury.  Assessment  1. Mixed hyperlipidemia   2. Age-related osteoporosis without current pathological fracture   3. History of right breast cancer   4. Myalgia   5. Left sided sciatica   6. Chronic pain of left knee   7. Primary osteoarthritis of knees, bilateral   8. Encounter for screening mammogram for breast cancer      Plan  Female Wellness Visit: Age appropriate Health Maintenance and Prevention measures were discussed with patient. Included topics are cancer screening recommendations, ways to keep healthy (see AVS) including dietary and exercise recommendations, regular eye and dental care, use of seat belts, and avoidance of moderate alcohol use and tobacco use.  Schedule mammogram BMI: discussed patient's BMI and encouraged positive lifestyle modifications to help get to or maintain a target BMI. HM needs and immunizations were addressed and ordered. See below for orders. See HM and immunization section for updates. Routine labs and screening tests ordered including cmp, cbc and lipids where appropriate. Discussed recommendations regarding Vit D and calcium supplementation (see AVS)  Chronic disease management visit and/or acute problem visit: Hyperlipidemia: Recheck lipids on statin.  May need adjustment.  See below. Myalgias: Possibly statin related.  Patient hold simvastatin for the next 2 to 4 weeks and then follow-up with me to let me know  how she is doing.  Can consider challenging with another statin if related.  Check CK.  Check thyroid. Chronic OA left knee: Patient reports this is tolerable symptomatically.  She will follow-up with sports medicine orthopedics if gets worse.  She strongly does not want surgical intervention. Left sciatica: Clinically sciatica.  Prednisone taper and stretching  exercises discussed and informational handout given.  Check lumbar x-rays.  Consider spinal stenosis if not getting better.  May warrant further imaging.  Patient declines pain medicine at this time. Osteoporosis: Mildly improved on Fosamax.  Recheck DEXA next year Today's visit was 53 minutes long. Greater than 50% of this time was devoted to face to face counseling with the patient and coordination of care. We discussed her diagnosis, prognosis, treatment options and treatment plan is documented above.    Follow up: 12 months for complete physical   Orders Placed This Encounter  Procedures   DG Lumbar Spine Complete   MM DIGITAL SCREENING BILATERAL   CBC with Differential/Platelet   Comprehensive metabolic panel   Lipid panel   CK (Creatine Kinase)   Meds ordered this encounter  Medications   predniSONE (DELTASONE) 10 MG tablet    Sig: Take 4 tabs qd x 2 days, 3 qd x 2 days, 2 qd x 2d, 1qd x 3 days    Dispense:  21 tablet    Refill:  0      Body mass index is 32.58 kg/m. Wt Readings from Last 3 Encounters:  03/26/21 220 lb 9.6 oz (100.1 kg)  06/11/20 214 lb (97.1 kg)  03/19/20 214 lb 12.8 oz (97.4 kg)     Patient Active Problem List   Diagnosis Date Noted   Chronic pain of left knee 03/13/2019   Primary osteoarthritis of knees, bilateral 03/13/2019    Right knee responded to steroid injection 2019; left knee with djd changes in medial and patellofemoral areas on xrays 07/2018. No response to steroid injection at that time.  SM referral 03/2019     Mixed hyperlipidemia 04/25/2018   Age-related osteoporosis without current pathological fracture 04/22/2016    DEXA 03/2020: mildly improving osteoporosis/osteopenia; continue fosamax. DEXA 04/2018: stable osteoporosis; continue fosamax.  DEXA scan July 2017: Hip T equals -2.8, spine equals -2.5; discussed osteoporosis medications and started fosamax weekly     History of right breast cancer 10/01/2015    Modified radical  mastectomy with reconstruction     Primary osteoarthritis of both hands 10/01/2015   Health Maintenance  Topic Date Due   COVID-19 Vaccine (3 - Pfizer risk series) 02/07/2020   TETANUS/TDAP  10/24/2021 (Originally 09/30/2018)   INFLUENZA VACCINE  05/05/2021   MAMMOGRAM  05/14/2021   Fecal DNA (Cologuard)  04/11/2022   DEXA SCAN  05/14/2022   Hepatitis C Screening  Completed   PNA vac Low Risk Adult  Completed   Zoster Vaccines- Shingrix  Completed   HPV VACCINES  Aged Out   Immunization History  Administered Date(s) Administered   Fluad Quad(high Dose 65+) 06/11/2020   Influenza, High Dose Seasonal PF 06/16/2018, 06/23/2019   PFIZER(Purple Top)SARS-COV-2 Vaccination 12/10/2019, 01/10/2020   Pneumococcal Conjugate-13 10/16/2015   Pneumococcal Polysaccharide-23 12/18/2016   Tdap 09/30/2008   Zoster Recombinat (Shingrix) 06/16/2018, 11/11/2018   Zoster, Live 09/30/2005   We updated and reviewed the patient's past history in detail and it is documented below. Allergies: Patient is allergic to codeine. Past Medical History Patient  has a past medical history of Age-related osteoporosis without current pathological fracture, Arthritis, Breast  cancer ([place]) (1989), and Primary osteoarthritis of knees, bilateral (03/13/2019). Past Surgical History Patient  has a past surgical history that includes Mastectomy modified radical (Right, 1989) and Breast reconstruction (Right). Family History: Patient family history includes Arthritis in her mother; Brain cancer in her father; Diabetes in her daughter; Healthy in her sister and son; Hyperlipidemia in her father and mother; Osteoporosis in her mother; Polymyalgia rheumatica in her mother. Social History:  Patient  reports that she has never smoked. She has never used smokeless tobacco. She reports that she does not drink alcohol and does not use drugs.  Review of Systems: Constitutional: negative for fever or malaise Ophthalmic: negative  for photophobia, double vision or loss of vision Cardiovascular: negative for chest pain, dyspnea on exertion, or new LE swelling Respiratory: negative for SOB or persistent cough Gastrointestinal: negative for abdominal pain, change in bowel habits or melena Genitourinary: negative for dysuria or gross hematuria, no abnormal uterine bleeding or disharge Musculoskeletal: negative for new gait disturbance or muscular weakness Integumentary: negative for new or persistent rashes, no breast lumps Neurological: negative for TIA or stroke symptoms Psychiatric: negative for SI or delusions Allergic/Immunologic: negative for hives  Patient Care Team    Relationship Specialty Notifications Start End  Leamon Arnt, MD PCP - General Family Medicine  01/20/18   Eunice Blase, MD Consulting Physician Sports Medicine  10/03/19     Objective  Vitals: BP 127/72   Pulse 63   Temp 98.2 F (36.8 C) (Temporal)   Ht 5\' 9"  (1.753 m)   Wt 220 lb 9.6 oz (100.1 kg)   SpO2 98%   BMI 32.58 kg/m  General:  Well developed, well nourished, no acute distress  Psych:  Alert and orientedx3,normal mood and affect HEENT:  Normocephalic, atraumatic, non-icteric sclera,  supple neck without adenopathy, mass or thyromegaly Cardiovascular:  Normal S1, S2, RRR without gallop, rub or murmur Respiratory:  Good breath sounds bilaterally, CTAB with normal respiratory effort Gastrointestinal: normal bowel sounds, soft, non-tender, no noted masses. No HSM MSK: no deformities, contusions. Joints are without erythema or swelling.  Normal range of motion left hip Skin:  Warm, no rashes or suspicious lesions noted Neurologic:    Mental status is normal. CN 2-11 are normal. Gross motor and sensory exams are normal. Normal gait. No tremor Back: Left sciatic notch tender, no SI joint tenderness or paravertebral lumbar tenderness.  Full range of motion.  Negative straight leg raise bilaterally.  Normal DTRs. Breast Exam: Right  breast status post reconstructive surgery.  No masses noted.  No axillary lymphadenopathy.  Left breast exam normal.  Commons side effects, risks, benefits, and alternatives for medications and treatment plan prescribed today were discussed, and the patient expressed understanding of the given instructions. Patient is instructed to call or message via MyChart if he/she has any questions or concerns regarding our treatment plan. No barriers to understanding were identified. We discussed Red Flag symptoms and signs in detail. Patient expressed understanding regarding what to do in case of urgent or emergency type symptoms.  Medication list was reconciled, printed and provided to the patient in AVS. Patient instructions and summary information was reviewed with the patient as documented in the AVS. This note was prepared with assistance of Dragon voice recognition software. Occasional wrong-word or sound-a-like substitutions may have occurred due to the inherent limitations of voice recognition software  This visit occurred during the SARS-CoV-2 public health emergency.  Safety protocols were in place, including screening questions prior to the  visit, additional usage of staff PPE, and extensive cleaning of exam room while observing appropriate contact time as indicated for disinfecting solutions.

## 2021-03-26 NOTE — Patient Instructions (Signed)
Please return in 12 months for your annual complete physical; please come fasting. Sooner if needed for sciatica or knee pain.   I will release your lab results to you on your MyChart account with further instructions. Please reply with any questions.    Hold the simvastatin for 2-4 weeks to see if your aches and pains in your arms subsides. Let me know what happens and I will adjust your medicines accordingly.   Take the prednisone for one week as directed and start the piriformis syndrome exercises to see if we can help your left sciatica symptoms. There is information below as well.   If you have any questions or concerns, please don't hesitate to send me a message via MyChart or call the office at [phone]. Thank you for visiting with [place] today! It's our pleasure caring for you.   I have ordered a mammogram and/or bone density for you as we discussed today: [x]   Mammogram  []   Bone Density  Please call the office checked below to schedule your appointment:  []   [facility]      [place], [place]         [x]   [facility]  [place], [place]   Sciatica  Sciatica is pain, numbness, weakness, or tingling along the path of the sciatic nerve. The sciatic nerve starts in the lower back and runs down the back of each leg. The nerve controls the muscles in the lower leg and in the back of the knee. It also provides feeling (sensation) to the back of the thigh, the lower leg, and the sole of the foot. Sciatica is a symptom of another medical condition that pinches or puts pressure on thesciatic nerve. Sciatica most often only affects one side of the body. Sciatica usually goes away on its own or with treatment. In some cases, sciatica may come back (recur). What are the causes? This condition is caused by pressure on the sciatic nerve or pinching of the nerve. This may be the  result of: A disk in between the bones of the spine bulging out too far (herniated disk). Age-related changes in the spinal disks. A pain disorder that affects a muscle in the buttock. Extra bone growth near the sciatic nerve. A break (fracture) of the pelvis. Pregnancy. Tumor. This is rare. What increases the risk? The following factors may make you more likely to develop this condition: Playing sports that place pressure or stress on the spine. Having poor strength and flexibility. A history of back injury or surgery. Sitting for long periods of time. Doing activities that involve repetitive bending or lifting. Obesity. What are the signs or symptoms? Symptoms can vary from mild to very severe, and they may include: Any of these problems in the lower back, leg, hip, or buttock: Mild tingling, numbness, or dull aches. Burning sensations. Sharp pains. Numbness in the back of the calf or the sole of the foot. Leg weakness. Severe back pain that makes movement difficult. Symptoms may get worse when you cough, sneeze, or laugh, or when you sit orstand for long periods of time. How is this diagnosed? This condition may be diagnosed based on: Your symptoms and medical history. A physical exam. Blood tests. Imaging tests, such as: X-rays. MRI. CT scan. How is this treated? In many cases, this condition improves  on its own without treatment. However, treatment may include: Reducing or modifying physical activity. Exercising and stretching. Icing and applying heat to the affected area. Medicines that help to: Relieve pain and swelling. Relax your muscles. Injections of medicines that help to relieve pain, irritation, and inflammation around the sciatic nerve (steroids). Surgery. Follow these instructions at home: Medicines Take over-the-counter and prescription medicines only as told by your health care provider. Ask your health care provider if the medicine prescribed to  you: Requires you to avoid driving or using heavy machinery. Can cause constipation. You may need to take these actions to prevent or treat constipation: Drink enough fluid to keep your urine pale yellow. Take over-the-counter or prescription medicines. Eat foods that are high in fiber, such as beans, whole grains, and fresh fruits and vegetables. Limit foods that are high in fat and processed sugars, such as fried or sweet foods. Managing pain     If directed, put ice on the affected area. Put ice in a plastic bag. Place a towel between your skin and the bag. Leave the ice on for 20 minutes, 2-3 times a day. If directed, apply heat to the affected area. Use the heat source that your health care provider recommends, such as a moist heat pack or a heating pad. Place a towel between your skin and the heat source. Leave the heat on for 20-30 minutes. Remove the heat if your skin turns bright red. This is especially important if you are unable to feel pain, heat, or cold. You may have a greater risk of getting burned. Activity  Return to your normal activities as told by your health care provider. Ask your health care provider what activities are safe for you. Avoid activities that make your symptoms worse. Take brief periods of rest throughout the day. When you rest for longer periods, mix in some mild activity or stretching between periods of rest. This will help to prevent stiffness and pain. Avoid sitting for long periods of time without moving. Get up and move around at least one time each hour. Exercise and stretch regularly, as told by your health care provider. Do not lift anything that is heavier than 10 lb (4.5 kg) while you have symptoms of sciatica. When you do not have symptoms, you should still avoid heavy lifting, especially repetitive heavy lifting. When you lift objects, always use proper lifting technique, which includes: Bending your knees. Keeping the load close to your  body. Avoiding twisting.  General instructions Maintain a healthy weight. Excess weight puts extra stress on your back. Wear supportive, comfortable shoes. Avoid wearing high heels. Avoid sleeping on a mattress that is too soft or too hard. A mattress that is firm enough to support your back when you sleep may help to reduce your pain. Keep all follow-up visits as told by your health care provider. This is important. Contact a health care provider if: You have pain that: Wakes you up when you are sleeping. Gets worse when you lie down. Is worse than you have experienced in the past. Lasts longer than 4 weeks. You have an unexplained weight loss. Get help right away if: You are not able to control when you urinate or have bowel movements (incontinence). You have: Weakness in your lower back, pelvis, buttocks, or legs that gets worse. Redness or swelling of your back. A burning sensation when you urinate. Summary Sciatica is pain, numbness, weakness, or tingling along the path of the sciatic nerve. This condition is  caused by pressure on the sciatic nerve or pinching of the nerve. Sciatica can cause pain, numbness, or tingling in the lower back, legs, hips, and buttocks. Treatment often includes rest, exercise, medicines, and applying ice or heat. This information is not intended to replace advice given to you by your health care provider. Make sure you discuss any questions you have with your healthcare provider. Document Revised: 10/10/2018 Document Reviewed: 10/10/2018 Elsevier Patient Education  [place].

## 2021-03-27 DIAGNOSIS — Z20822 Contact with and (suspected) exposure to covid-19: Secondary | ICD-10-CM | POA: Diagnosis not present

## 2021-05-20 DIAGNOSIS — Z1231 Encounter for screening mammogram for malignant neoplasm of breast: Secondary | ICD-10-CM | POA: Diagnosis not present

## 2021-05-20 LAB — HM MAMMOGRAPHY

## 2021-05-22 ENCOUNTER — Encounter: Payer: Self-pay | Admitting: Family Medicine

## 2021-06-11 DIAGNOSIS — Z20822 Contact with and (suspected) exposure to covid-19: Secondary | ICD-10-CM | POA: Diagnosis not present

## 2021-08-11 DIAGNOSIS — Z20822 Contact with and (suspected) exposure to covid-19: Secondary | ICD-10-CM | POA: Diagnosis not present

## 2021-09-15 DIAGNOSIS — Z20822 Contact with and (suspected) exposure to covid-19: Secondary | ICD-10-CM | POA: Diagnosis not present

## 2021-09-23 DIAGNOSIS — Z20822 Contact with and (suspected) exposure to covid-19: Secondary | ICD-10-CM | POA: Diagnosis not present

## 2021-10-30 ENCOUNTER — Other Ambulatory Visit: Payer: Self-pay

## 2021-10-30 ENCOUNTER — Ambulatory Visit (INDEPENDENT_AMBULATORY_CARE_PROVIDER_SITE_OTHER): Payer: Medicare Other

## 2021-10-30 DIAGNOSIS — Z0000 Encounter for general adult medical examination without abnormal findings: Secondary | ICD-10-CM | POA: Diagnosis not present

## 2021-10-30 NOTE — Patient Instructions (Addendum)
Susan Beard , Thank you for taking time to come for your Medicare Wellness Visit. I appreciate your ongoing commitment to your health goals. Please review the following plan we discussed and let me know if I can assist you in the future.   Screening recommendations/referrals: Colonoscopy: Done 04/12/19 repeat every 3 years  Mammogram: Done 05/20/21 repeat every year  Bone Density: Done 05/14/20 repeat every 2 years  Recommended yearly ophthalmology/optometry visit for glaucoma screening and checkup Recommended yearly dental visit for hygiene and checkup  Vaccinations: Influenza vaccine: Due and discussed Pneumococcal vaccine: Up to date Tdap vaccine: Due and discussed Shingles vaccine: Completed 06/16/18 & 11/11/18   Covid-19:Completed 3/7, 01/10/20  Advanced directives: Please bring a copy of your health care power of attorney and living will to the office at your convenience.  Conditions/risks identified: None at this time   Next appointment: Follow up in one year for your annual wellness visit    Preventive Care 65 Years and Older, Female Preventive care refers to lifestyle choices and visits with your health care provider that can promote health and wellness. What does preventive care include? A yearly physical exam. This is also called an annual well check. Dental exams once or twice a year. Routine eye exams. Ask your health care provider how often you should have your eyes checked. Personal lifestyle choices, including: Daily care of your teeth and gums. Regular physical activity. Eating a healthy diet. Avoiding tobacco and drug use. Limiting alcohol use. Practicing safe sex. Taking low-dose aspirin every day. Taking vitamin and mineral supplements as recommended by your health care provider. What happens during an annual well check? The services and screenings done by your health care provider during your annual well check will depend on your age, overall health, lifestyle  risk factors, and family history of disease. Counseling  Your health care provider may ask you questions about your: Alcohol use. Tobacco use. Drug use. Emotional well-being. Home and relationship well-being. Sexual activity. Eating habits. History of falls. Memory and ability to understand (cognition). Work and work Statistician. Reproductive health. Screening  You may have the following tests or measurements: Height, weight, and BMI. Blood pressure. Lipid and cholesterol levels. These may be checked every 5 years, or more frequently if you are over 42 years old. Skin check. Lung cancer screening. You may have this screening every year starting at age 57 if you have a 30-pack-year history of smoking and currently smoke or have quit within the past 15 years. Fecal occult blood test (FOBT) of the stool. You may have this test every year starting at age 27. Flexible sigmoidoscopy or colonoscopy. You may have a sigmoidoscopy every 5 years or a colonoscopy every 10 years starting at age 50. Hepatitis C blood test. Hepatitis B blood test. Sexually transmitted disease (STD) testing. Diabetes screening. This is done by checking your blood sugar (glucose) after you have not eaten for a while (fasting). You may have this done every 1-3 years. Bone density scan. This is done to screen for osteoporosis. You may have this done starting at age 45. Mammogram. This may be done every 1-2 years. Talk to your health care provider about how often you should have regular mammograms. Talk with your health care provider about your test results, treatment options, and if necessary, the need for more tests. Vaccines  Your health care provider may recommend certain vaccines, such as: Influenza vaccine. This is recommended every year. Tetanus, diphtheria, and acellular pertussis (Tdap, Td) vaccine. You may  need a Td booster every 10 years. Zoster vaccine. You may need this after age 86. Pneumococcal  13-valent conjugate (PCV13) vaccine. One dose is recommended after age 90. Pneumococcal polysaccharide (PPSV23) vaccine. One dose is recommended after age 72. Talk to your health care provider about which screenings and vaccines you need and how often you need them. This information is not intended to replace advice given to you by your health care provider. Make sure you discuss any questions you have with your health care provider. Document Released: 10/18/2015 Document Revised: 06/10/2016 Document Reviewed: 07/23/2015 Elsevier Interactive Patient Education  2017 [employer] Prevention in the Home Falls can cause injuries. They can happen to people of all ages. There are many things you can do to make your home safe and to help prevent falls. What can I do on the outside of my home? Regularly fix the edges of walkways and driveways and fix any cracks. Remove anything that might make you trip as you walk through a door, such as a raised step or threshold. Trim any bushes or trees on the path to your home. Use bright outdoor lighting. Clear any walking paths of anything that might make someone trip, such as rocks or tools. Regularly check to see if handrails are loose or broken. Make sure that both sides of any steps have handrails. Any raised decks and porches should have guardrails on the edges. Have any leaves, snow, or ice cleared regularly. Use sand or salt on walking paths during winter. Clean up any spills in your garage right away. This includes oil or grease spills. What can I do in the bathroom? Use night lights. Install grab bars by the toilet and in the tub and shower. Do not use towel bars as grab bars. Use non-skid mats or decals in the tub or shower. If you need to sit down in the shower, use a plastic, non-slip stool. Keep the floor dry. Clean up any water that spills on the floor as soon as it happens. Remove soap buildup in the tub or shower regularly. Attach  bath mats securely with double-sided non-slip rug tape. Do not have throw rugs and other things on the floor that can make you trip. What can I do in the bedroom? Use night lights. Make sure that you have a light by your bed that is easy to reach. Do not use any sheets or blankets that are too big for your bed. They should not hang down onto the floor. Have a firm chair that has side arms. You can use this for support while you get dressed. Do not have throw rugs and other things on the floor that can make you trip. What can I do in the kitchen? Clean up any spills right away. Avoid walking on wet floors. Keep items that you use a lot in easy-to-reach places. If you need to reach something above you, use a strong step stool that has a grab bar. Keep electrical cords out of the way. Do not use floor polish or wax that makes floors slippery. If you must use wax, use non-skid floor wax. Do not have throw rugs and other things on the floor that can make you trip. What can I do with my stairs? Do not leave any items on the stairs. Make sure that there are handrails on both sides of the stairs and use them. Fix handrails that are broken or loose. Make sure that handrails are as long as the stairways.  Check any carpeting to make sure that it is firmly attached to the stairs. Fix any carpet that is loose or worn. Avoid having throw rugs at the top or bottom of the stairs. If you do have throw rugs, attach them to the floor with carpet tape. Make sure that you have a light switch at the top of the stairs and the bottom of the stairs. If you do not have them, ask someone to add them for you. What else can I do to help prevent falls? Wear shoes that: Do not have high heels. Have rubber bottoms. Are comfortable and fit you well. Are closed at the toe. Do not wear sandals. If you use a stepladder: Make sure that it is fully opened. Do not climb a closed stepladder. Make sure that both sides of the  stepladder are locked into place. Ask someone to hold it for you, if possible. Clearly mark and make sure that you can see: Any grab bars or handrails. First and last steps. Where the edge of each step is. Use tools that help you move around (mobility aids) if they are needed. These include: Canes. Walkers. Scooters. Crutches. Turn on the lights when you go into a dark area. Replace any light bulbs as soon as they burn out. Set up your furniture so you have a clear path. Avoid moving your furniture around. If any of your floors are uneven, fix them. If there are any pets around you, be aware of where they are. Review your medicines with your doctor. Some medicines can make you feel dizzy. This can increase your chance of falling. Ask your doctor what other things that you can do to help prevent falls. This information is not intended to replace advice given to you by your health care provider. Make sure you discuss any questions you have with your health care provider. Document Released: 07/18/2009 Document Revised: 02/27/2016 Document Reviewed: 10/26/2014 Elsevier Interactive Patient Education  2017 [employer].

## 2021-10-30 NOTE — Progress Notes (Addendum)
Virtual Visit via Telephone Note  I connected with  Susan Beard on 10/30/21 at  8:00 AM EST by telephone and verified that I am speaking with the correct person using two identifiers.  Medicare Annual Wellness visit completed telephonically due to Covid-19 pandemic.   Persons participating in this call: This Health Coach and this patient.   Location: Patient: Home Provider: [facility]   I discussed the limitations, risks, security and privacy concerns of performing an evaluation and management service by telephone and the availability of in person appointments. The patient expressed understanding and agreed to proceed.  Unable to perform video visit due to video visit attempted and failed and/or patient does not have video capability.   Some vital signs may be absent or patient reported.   Willette Brace, LPN   Subjective:   Susan Beard is a 71 y.o. female who presents for Medicare Annual (Subsequent) preventive examination.  Review of Systems     Cardiac Risk Factors include: advanced age (>92men, >31 women)     Objective:    There were no vitals filed for this visit. There is no height or weight on file to calculate BMI.  Advanced Directives 10/30/2021 10/24/2020 10/03/2019 09/02/2016  Does Patient Have a Medical Advance Directive? Yes Yes Yes No  Type of Paramedic of [place];Living will Living will;Healthcare Power of Attorney -  Does patient want to make changes to medical advance directive? - - No - Patient declined -  Copy of [place] in Chart? No - copy requested No - copy requested No - copy requested -  Would patient like information on creating a medical advance directive? - - - No - Patient declined    Current Medications (verified) Outpatient Encounter Medications as of 10/30/2021  Medication Sig   alendronate (FOSAMAX) 70 MG tablet TAKE ONE TABLET BY MOUTH ONCE WEEKLY    diphenhydrAMINE (BENADRYL) 50 MG tablet Take 50 mg by mouth at bedtime as needed for itching.   Multiple Vitamin (MULITIVITAMIN WITH MINERALS) TABS Take 1 tablet by mouth daily.   simvastatin (ZOCOR) 10 MG tablet TAKE ONE TABLET BY MOUTH EVERY NIGHT AT BEDTIME (Patient not taking: Reported on 10/30/2021)   [DISCONTINUED] predniSONE (DELTASONE) 10 MG tablet Take 4 tabs qd x 2 days, 3 qd x 2 days, 2 qd x 2d, 1qd x 3 days   No facility-administered encounter medications on file as of 10/30/2021.    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   Past Surgical History:  Procedure Laterality Date   BREAST RECONSTRUCTION Right    MASTECTOMY MODIFIED RADICAL Right 1989   Family History  Problem Relation Age of Onset   Arthritis Mother    Hyperlipidemia Mother    Osteoporosis Mother    Polymyalgia rheumatica Mother    Brain cancer Father    Hyperlipidemia Father    Healthy Sister    Diabetes Daughter    Healthy Son    Social History   Socioeconomic History   Marital status: Married    Spouse name: Not on file   Number of children: 2   Years of education: Not on file   Highest education level: Not on file  Occupational History  Occupation: Licensed Animator: UNKNOWN  Tobacco Use   Smoking status: Never   Smokeless tobacco: Never  Vaping Use   Vaping Use: Never used  Substance and Sexual Activity   Alcohol use: No   Drug use: No   Sexual activity: Yes    Birth control/protection: Post-menopausal  Other Topics Concern   Not on file  Social History Narrative   14 steps in home    Social Determinants of Health   Financial Resource Strain: Low Risk    Difficulty of Paying Living Expenses: Not hard at all  Food Insecurity: No  Food Insecurity   Worried About Charity fundraiser in the Last Year: Never true   [place] in the Last Year: Never true  Transportation Needs: No Transportation Needs   Lack of Transportation (Medical): No   Lack of Transportation (Non-Medical): No  Physical Activity: Inactive   Days of Exercise per Week: 0 days   Minutes of Exercise per Session: 0 min  Stress: No Stress Concern Present   Feeling of Stress : Not at all  Social Connections: Moderately Integrated   Frequency of Communication with Friends and Family: More than three times a week   Frequency of Social Gatherings with Friends and Family: Three times a week   Attends Religious Services: More than 4 times per year   Active Member of Clubs or Organizations: No   Attends Archivist Meetings: Never   Marital Status: Married    Tobacco Counseling Counseling given: Not Answered   Clinical Intake:  Pre-visit preparation completed: Yes  Pain : No/denies pain     BMI - recorded: 32.58 Nutritional Status: BMI > 30  Obese Nutritional Risks: None Diabetes: No  How often do you need to have someone help you when you read instructions, pamphlets, or other written materials from your doctor or pharmacy?: 1 - Never  Diabetic?No  Interpreter Needed?: No  Information entered by :: Charlott Rakes, LPN   Activities of Daily Living In your present state of health, do you have any difficulty performing the following activities: 10/30/2021  Hearing? N  Vision? N  Difficulty concentrating or making decisions? N  Walking or climbing stairs? N  Dressing or bathing? N  Doing errands, shopping? N  Preparing Food and eating ? N  Using the Toilet? N  In the past six months, have you accidently leaked urine? N  Do you have problems with loss of bowel control? N  Managing your Medications? N  Managing your Finances? N  Housekeeping or managing your Housekeeping? N  Some recent data might be hidden     Patient Care Team: Leamon Arnt, MD as PCP - General (Family Medicine) Junius Roads, Legrand Como, MD as Consulting Physician (Sports Medicine)  Indicate any recent Medical Services you may have received from other than [facility] providers in the past year (date may be approximate).     Assessment:   This is a routine wellness examination for Susan Beard.  Hearing/Vision screen Hearing Screening - Comments:: Pt denies any hearing issues  Vision Screening - Comments:: Pt follows up with Dr Jabier Mutton for annual eye exams   Dietary issues and exercise activities discussed: Current Exercise Habits: The patient does not participate in regular exercise at present   Goals Addressed             This Visit's Progress    Patient Stated       None at this time  Depression Screen PHQ 2/9 Scores 10/30/2021 10/24/2020 10/03/2019 03/13/2019 01/20/2018  PHQ - 2 Score 0 0 0 0 0    Fall Risk Fall Risk  10/30/2021 10/24/2020 10/03/2019 03/13/2019 01/20/2018  Falls in the past year? 0 1 0 0 No  Number falls in past yr: 0 1 - 0 -  Injury with Fall? 0 1 0 0 -  Comment - cracked tailbone - - -  Risk for fall due to : Impaired vision Impaired vision;History of fall(s) - - -  Risk for fall due to: Comment - slipped on ice - - -  Follow up Falls prevention discussed Falls prevention discussed Falls evaluation completed;Education provided;Falls prevention discussed Falls evaluation completed -    FALL RISK PREVENTION PERTAINING TO THE HOME:  Any stairs in or around the home? Yes  If so, are there any without handrails? No  Home free of loose throw rugs in walkways, pet beds, electrical cords, etc? Yes  Adequate lighting in your home to reduce risk of falls? Yes   ASSISTIVE DEVICES UTILIZED TO PREVENT FALLS:  Life alert? No  Use of a cane, walker or w/c? No  Grab bars in the bathroom? No  Shower chair or bench in shower? Yes  Elevated toilet seat or a handicapped toilet? No   TIMED UP AND GO:  Was the test  performed? No .   Cognitive Function:     6CIT Screen 10/30/2021 10/24/2020  What Year? 0 points 0 points  What month? 0 points 0 points  What time? 0 points -  Count back from 20 0 points 0 points  Months in reverse 0 points 0 points  Repeat phrase 0 points 0 points  Total Score 0 -    Immunizations Immunization History  Administered Date(s) Administered   Fluad Quad(high Dose 65+) 06/11/2020   Influenza, High Dose Seasonal PF 06/16/2018, 06/23/2019   PFIZER(Purple Top)SARS-COV-2 Vaccination 12/10/2019, 01/10/2020   Pneumococcal Conjugate-13 10/16/2015   Pneumococcal Polysaccharide-23 12/18/2016   Tdap 09/30/2008   Zoster Recombinat (Shingrix) 06/16/2018, 11/11/2018   Zoster, Live 09/30/2005    TDAP status: Due, Education has been provided regarding the importance of this vaccine. Advised may receive this vaccine at local pharmacy or Health Dept. Aware to provide a copy of the vaccination record if obtained from local pharmacy or Health Dept. Verbalized acceptance and understanding.  Flu Vaccine status: Due, Education has been provided regarding the importance of this vaccine. Advised may receive this vaccine at local pharmacy or Health Dept. Aware to provide a copy of the vaccination record if obtained from local pharmacy or Health Dept. Verbalized acceptance and understanding.  Pneumococcal vaccine status: Up to date  Covid-19 vaccine status: Completed vaccines  Qualifies for Shingles Vaccine? Yes   Zostavax completed Yes   Shingrix Completed?: Yes  Screening Tests Health Maintenance  Topic Date Due   TETANUS/TDAP  09/30/2018   COVID-19 Vaccine (3 - Pfizer risk series) 02/07/2020   INFLUENZA VACCINE  05/05/2021   Fecal DNA (Cologuard)  04/11/2022   DEXA SCAN  05/14/2022   MAMMOGRAM  05/20/2022   Pneumonia Vaccine 58+ Years old  Completed   Hepatitis C Screening  Completed   Zoster Vaccines- Shingrix  Completed   HPV VACCINES  Aged Out    Health  Maintenance  Health Maintenance Due  Topic Date Due   TETANUS/TDAP  09/30/2018   COVID-19 Vaccine (3 - Pfizer risk series) 02/07/2020   INFLUENZA VACCINE  05/05/2021    Colorectal cancer screening: Type of  screening: Cologuard. Completed 04/12/19. Repeat every 3 years  Mammogram status: Completed 05/20/21. Repeat every year  Bone Density status: Completed 05/14/20. Results reflect: Bone density results: OSTEOPOROSIS. Repeat every 2 years.  Additional Screening:  Hepatitis C Screening: Completed 01/20/18  Vision Screening: Recommended annual ophthalmology exams for early detection of glaucoma and other disorders of the eye. Is the patient up to date with their annual eye exam?  Yes  Who is the provider or what is the name of the office in which the patient attends annual eye exams? Dr Jabier Mutton If pt is not established with a provider, would they like to be referred to a provider to establish care? No .   Dental Screening: Recommended annual dental exams for proper oral hygiene  Community Resource Referral / Chronic Care Management: CRR required this visit?  No   CCM required this visit?  No      Plan:     I have personally reviewed and noted the following in the patients chart:   Medical and social history Use of alcohol, tobacco or illicit drugs  Current medications and supplements including opioid prescriptions.  Functional ability and status Nutritional status Physical activity Advanced directives List of other physicians Hospitalizations, surgeries, and ER visits in previous 12 months Vitals Screenings to include cognitive, depression, and falls Referrals and appointments  In addition, I have reviewed and discussed with patient certain preventive protocols, quality metrics, and best practice recommendations. A written personalized care plan for preventive services as well as general preventive health recommendations were provided to patient.     Willette Brace,  LPN   [id number]/[id number]   Nurse Notes: None

## 2021-12-08 ENCOUNTER — Ambulatory Visit (INDEPENDENT_AMBULATORY_CARE_PROVIDER_SITE_OTHER): Payer: Medicare Other | Admitting: Family Medicine

## 2021-12-08 ENCOUNTER — Other Ambulatory Visit: Payer: Self-pay

## 2021-12-08 ENCOUNTER — Encounter: Payer: Self-pay | Admitting: Family Medicine

## 2021-12-08 VITALS — BP 148/85 | HR 84 | Temp 97.5°F | Ht 69.0 in | Wt 219.4 lb

## 2021-12-08 DIAGNOSIS — M25551 Pain in right hip: Secondary | ICD-10-CM | POA: Diagnosis not present

## 2021-12-08 DIAGNOSIS — M25552 Pain in left hip: Secondary | ICD-10-CM | POA: Diagnosis not present

## 2021-12-08 NOTE — Progress Notes (Signed)
? ?Subjective  ?CC:  ?Chief Complaint  ?Patient presents with  ? Leg Pain  ?  She states that when sitting long periods of time, it is hard for her to get going when standing. She first noticed back in the fall of 2022. She has recently had 2 episodes the latest being Friday.    ? ? ?HPI: Susan Beard is a 71 y.o. female who presents to the office today to address the problems listed above in the chief complaint. ?71 year old with bilateral knee arthritis DJD of lumbar spine complains of 4 to 13-monthhistory of bilateral groin pain.  Most notable after prolonged sitting.  Tries to get up from chair and has pain, stiffness.  When she gets moving again things improved.  However with prolonged standing she will also have hip pain.  Mild antalgic gait at times.  No injury.  Has chronic knee arthritis which she reports is stable.  Has seen sports medicine in the past for this and has had injections.  Has low back pain with DJD.  Was seen in June with sciatic-like pain.  That comes and goes as well.  No bowel or bladder symptoms. ? ?Assessment  ?1. Bilateral hip pain   ? ?  ?Plan  ?Hip pain: Question hip arthritis.  Needs further evaluation.  Refer to orthopedics.  Patient defers any medications for this at this time.  Will need x-rays.  Referral placed ? ?Follow up: As scheduled for physical ?03/30/2022 ? ?Orders Placed This Encounter  ?Procedures  ? Ambulatory referral to Orthopedic Surgery  ? ?No orders of the defined types were placed in this encounter. ? ?  ? ?I reviewed the patients updated PMH, FH, and SocHx.  ?  ?Patient Active Problem List  ? Diagnosis Date Noted  ? Chronic pain of left knee 03/13/2019  ? Primary osteoarthritis of knees, bilateral 03/13/2019  ? Mixed hyperlipidemia 04/25/2018  ? Age-related osteoporosis without current pathological fracture 04/22/2016  ? History of right breast cancer 10/01/2015  ? Primary osteoarthritis of both hands 10/01/2015  ? ?Current Meds  ?Medication Sig  ? alendronate  (FOSAMAX) 70 MG tablet TAKE ONE TABLET BY MOUTH ONCE WEEKLY  ? diphenhydrAMINE (BENADRYL) 50 MG tablet Take 50 mg by mouth at bedtime as needed for itching.  ? Multiple Vitamin (MULITIVITAMIN WITH MINERALS) TABS Take 1 tablet by mouth daily.  ? ? ?Allergies: ?Patient is allergic to codeine. ?Family History: ?Patient family history includes Arthritis in her mother; Brain cancer in her father; Diabetes in her daughter; Healthy in her sister and son; Hyperlipidemia in her father and mother; Osteoporosis in her mother; Polymyalgia rheumatica in her mother. ?Social History:  ?Patient  reports that she has never smoked. She has never used smokeless tobacco. She reports that she does not drink alcohol and does not use drugs. ? ?Review of Systems: ?Constitutional: Negative for fever malaise or anorexia ?Cardiovascular: negative for chest pain ?Respiratory: negative for SOB or persistent cough ?Gastrointestinal: negative for abdominal pain ? ?Objective  ?Vitals: BP (!) 148/85   Pulse 84   Temp (!) 97.5 ?F (36.4 ?C) (Temporal)   Ht '5\' 9"'$  (1.753 m)   Wt 219 lb 6.4 oz (99.5 kg)   SpO2 98%   BMI 32.40 kg/m?  ?General: no acute distress , A&Ox3 ?Mildly antalgic gait ?No groin tenderness ?Full range of motion intact on right, decreased internal rotation with pain on the left.  Negative straight leg raise bilaterally. ? ? ? ?Commons side effects, risks, benefits,  and alternatives for medications and treatment plan prescribed today were discussed, and the patient expressed understanding of the given instructions. Patient is instructed to call or message via MyChart if he/she has any questions or concerns regarding our treatment plan. No barriers to understanding were identified. We discussed Red Flag symptoms and signs in detail. Patient expressed understanding regarding what to do in case of urgent or emergency type symptoms.  ?Medication list was reconciled, printed and provided to the patient in AVS. Patient instructions  and summary information was reviewed with the patient as documented in the AVS. ?This note was prepared with assistance of Systems analyst. Occasional wrong-word or sound-a-like substitutions may have occurred due to the inherent limitations of voice recognition software ? ?This visit occurred during the SARS-CoV-2 public health emergency.  Safety protocols were in place, including screening questions prior to the visit, additional usage of staff PPE, and extensive cleaning of exam room while observing appropriate contact time as indicated for disinfecting solutions.  ? ?

## 2021-12-08 NOTE — Patient Instructions (Signed)
Please follow up as scheduled for your next visit with me: 03/30/2022  ? ?I have placed a referral to orthopedics to help with your hip pain (and back pain). We will call you to get you scheduled.  ? ?If you have any questions or concerns, please don't hesitate to send me a message via MyChart or call the office at [phone]. Thank you for visiting with [place] today! It's our pleasure caring for you.  ? ?Hip Pain ?The hip is the joint between the upper legs and the lower pelvis. The bones, cartilage, tendons, and muscles of your hip joint support your body and allow you to move around. ?Hip pain can range from a minor ache to severe pain in one or both of your hips. The pain may be felt on the inside of the hip joint near the groin, or on the outside near the buttocks and upper thigh. You may also have swelling or stiffness in your hip area. ?Follow these instructions at home: ?Managing pain, stiffness, and swelling ?  ?If directed, put ice on the painful area. To do this: ?Put ice in a plastic bag. ?Place a towel between your skin and the bag. ?Leave the ice on for 20 minutes, 2-3 times a day. ?If directed, apply heat to the affected area as often as told by your health care provider. Use the heat source that your health care provider recommends, such as a moist heat pack or a heating pad. ?Place a towel between your skin and the heat source. ?Leave the heat on for 20-30 minutes. ?Remove the heat if your skin turns bright red. This is especially important if you are unable to feel pain, heat, or cold. You may have a greater risk of getting burned. ?Activity ?Do exercises as told by your health care provider. ?Avoid activities that cause pain. ?General instructions ? ?Take over-the-counter and prescription medicines only as told by your health care provider. ?Keep a journal of your symptoms. Write down: ?How often you have hip pain. ?The location of your pain. ?What the pain feels like. ?What makes the pain  worse. ?Sleep with a pillow between your legs on your most comfortable side. ?Keep all follow-up visits as told by your health care provider. This is important. ?Contact a health care provider if: ?You cannot put weight on your leg. ?Your pain or swelling continues or gets worse after one week. ?It gets harder to walk. ?You have a fever. ?Get help right away if: ?You fall. ?You have a sudden increase in pain and swelling in your hip. ?Your hip is red or swollen or very tender to touch. ?Summary ?Hip pain can range from a minor ache to severe pain in one or both of your hips. ?The pain may be felt on the inside of the hip joint near the groin, or on the outside near the buttocks and upper thigh. ?Avoid activities that cause pain. ?Write down how often you have hip pain, the location of the pain, what makes it worse, and what it feels like. ?This information is not intended to replace advice given to you by your health care provider. Make sure you discuss any questions you have with your health care provider. ?Document Revised: 02/06/2019 Document Reviewed: 02/06/2019 ?Elsevier Patient Education ? [place]. ? ?

## 2021-12-18 DIAGNOSIS — Z20822 Contact with and (suspected) exposure to covid-19: Secondary | ICD-10-CM | POA: Diagnosis not present

## 2021-12-21 DIAGNOSIS — Z20828 Contact with and (suspected) exposure to other viral communicable diseases: Secondary | ICD-10-CM | POA: Diagnosis not present

## 2021-12-23 ENCOUNTER — Other Ambulatory Visit: Payer: Self-pay

## 2021-12-23 ENCOUNTER — Encounter: Payer: Self-pay | Admitting: Orthopaedic Surgery

## 2021-12-23 ENCOUNTER — Ambulatory Visit (INDEPENDENT_AMBULATORY_CARE_PROVIDER_SITE_OTHER): Payer: Medicare Other | Admitting: Orthopaedic Surgery

## 2021-12-23 ENCOUNTER — Ambulatory Visit (INDEPENDENT_AMBULATORY_CARE_PROVIDER_SITE_OTHER): Payer: Medicare Other

## 2021-12-23 VITALS — BP 151/89 | HR 85 | Ht 68.5 in | Wt 222.4 lb

## 2021-12-23 DIAGNOSIS — M25551 Pain in right hip: Secondary | ICD-10-CM | POA: Diagnosis not present

## 2021-12-23 DIAGNOSIS — M545 Low back pain, unspecified: Secondary | ICD-10-CM

## 2021-12-23 DIAGNOSIS — G8929 Other chronic pain: Secondary | ICD-10-CM

## 2021-12-23 DIAGNOSIS — M25552 Pain in left hip: Secondary | ICD-10-CM | POA: Diagnosis not present

## 2021-12-23 NOTE — Progress Notes (Signed)
? ?Office Visit Note ?  ?Patient: Susan Beard           ?Date of Birth: Jun 05, 1951           ?MRN: [id number] ?Visit Date: 12/23/2021 ?             ?Requested by: Leamon Arnt, MD ?[address] ?[place],   [id number] ?PCP: Leamon Arnt, MD ? ? ?Assessment & Plan: ?Visit Diagnoses:  ?1. Chronic left-sided low back pain, unspecified whether sciatica present   ?2. Bilateral hip pain   ? ? ?Plan: We will set patient up for some physical therapy.  She has degenerative anterolisthesis 8 to 9 mm at L4-5 and L5-S1.  They can work with core strengthening exercises and I will check her in 6 weeks. ? ?Follow-Up Instructions: Return in about 6 weeks (around 02/03/2022).  ? ?Orders:  ?Orders Placed This Encounter  ?Procedures  ? XR HIPS BILAT W OR W/O PELVIS 3-4 VIEWS  ? Ambulatory referral to Physical Therapy  ? ?No orders of the defined types were placed in this encounter. ? ? ? ? Procedures: ?No procedures performed ? ? ?Clinical Data: ?No additional findings. ? ? ?Subjective: ?Chief Complaint  ?Patient presents with  ? Left Hip - Pain  ? Right Hip - Pain  ? Lower Back - Pain  ? ? ?HPI 71 year old female noted problems walking starting in the fall.  She has pain  when she first stands up and then with a short stride gait.  She has not fallen she states the more she walks she tends to loosen up and the pain that she has in her groin and thigh gets better.  She has had problems with back pain in the past which she relates to sciatica off and on.  Previous lumbar radiographs June 2022 which showed grade 1 anterolisthesis with degenerative facet arthropathy at both L4-5 and L5-S1 grade 1 at both levels.  Past bone density test 2019 showed osteoporosis.  No associated bowel or bladder symptoms.  The more she walks she tends to get some improvement in her symptoms. ? ?Review of Systems no fever chills , history of right breast cancer with mastectomy.  All other systems noncontributory to HPI. ? ? ?Objective: ?Vital  Signs: BP (!) 151/89   Pulse 85   Ht 5' 8.5" (1.74 m)   Wt 222 lb 6.4 oz (100.9 kg)   BMI 33.32 kg/m?  ? ?Physical Exam ?Constitutional:   ?   Appearance: She is well-developed.  ?HENT:  ?   Head: Normocephalic.  ?   Right Ear: External ear normal.  ?   Left Ear: External ear normal. There is no impacted cerumen.  ?Eyes:  ?   Pupils: Pupils are equal, round, and reactive to light.  ?Neck:  ?   Thyroid: No thyromegaly.  ?   Trachea: No tracheal deviation.  ?Cardiovascular:  ?   Rate and Rhythm: Normal rate.  ?Pulmonary:  ?   Effort: Pulmonary effort is normal.  ?Abdominal:  ?   Palpations: Abdomen is soft.  ?Musculoskeletal:  ?   Cervical back: No rigidity.  ?Skin: ?   General: Skin is warm and dry.  ?Neurological:  ?   Mental Status: She is alert and oriented to person, place, and time.  ?Psychiatric:     ?   Behavior: Behavior normal.  ? ? ?Ortho Exam patient gets from sitting to standing slowly.  First few steps are short stride gait that  gradually elongate. ? ?Specialty Comments:  ?No specialty comments available. ? ?Imaging: ?No results found. ? ? ?PMFS History: ?Patient Active Problem List  ? Diagnosis Date Noted  ? Chronic pain of left knee 03/13/2019  ? Primary osteoarthritis of knees, bilateral 03/13/2019  ? Mixed hyperlipidemia 04/25/2018  ? Age-related osteoporosis without current pathological fracture 04/22/2016  ? History of right breast cancer 10/01/2015  ? Primary osteoarthritis of both hands 10/01/2015  ? ?Past Medical History:  ?Diagnosis Date  ? Age-related osteoporosis without current pathological fracture   ? Dexa Scan July 2017  ? Arthritis   ? Breast cancer ([place]) 1989  ? right  ? Primary osteoarthritis of knees, bilateral 03/13/2019  ?  ?Family History  ?Problem Relation Age of Onset  ? Arthritis Mother   ? Hyperlipidemia Mother   ? Osteoporosis Mother   ? Polymyalgia rheumatica Mother   ? Brain cancer Father   ? Hyperlipidemia Father   ? Healthy Sister   ? Diabetes Daughter   ? Healthy Son    ?  ?Past Surgical History:  ?Procedure Laterality Date  ? BREAST RECONSTRUCTION Right   ? MASTECTOMY MODIFIED RADICAL Right 1989  ? ?Social History  ? ?Occupational History  ? Occupation: Licensed Medical illustrator  ?  Employer: UNKNOWN  ?Tobacco Use  ? Smoking status: Never  ? Smokeless tobacco: Never  ?Vaping Use  ? Vaping Use: Never used  ?Substance and Sexual Activity  ? Alcohol use: No  ? Drug use: No  ? Sexual activity: Yes  ?  Birth control/protection: Post-menopausal  ? ? ? ? ? ? ?

## 2021-12-25 DIAGNOSIS — Z20822 Contact with and (suspected) exposure to covid-19: Secondary | ICD-10-CM | POA: Diagnosis not present

## 2022-01-01 ENCOUNTER — Telehealth: Payer: Self-pay | Admitting: Orthopaedic Surgery

## 2022-01-01 DIAGNOSIS — M5451 Vertebrogenic low back pain: Secondary | ICD-10-CM | POA: Diagnosis not present

## 2022-01-01 DIAGNOSIS — M25552 Pain in left hip: Secondary | ICD-10-CM | POA: Diagnosis not present

## 2022-01-01 DIAGNOSIS — M25551 Pain in right hip: Secondary | ICD-10-CM | POA: Diagnosis not present

## 2022-01-01 NOTE — Telephone Encounter (Signed)
12/23/21 xray report faxed to [facility] per their request. Patient has appt. There today. ?

## 2022-01-05 DIAGNOSIS — Z20822 Contact with and (suspected) exposure to covid-19: Secondary | ICD-10-CM | POA: Diagnosis not present

## 2022-01-06 DIAGNOSIS — M25552 Pain in left hip: Secondary | ICD-10-CM | POA: Diagnosis not present

## 2022-01-06 DIAGNOSIS — M5451 Vertebrogenic low back pain: Secondary | ICD-10-CM | POA: Diagnosis not present

## 2022-01-06 DIAGNOSIS — M25551 Pain in right hip: Secondary | ICD-10-CM | POA: Diagnosis not present

## 2022-01-08 DIAGNOSIS — M5451 Vertebrogenic low back pain: Secondary | ICD-10-CM | POA: Diagnosis not present

## 2022-01-08 DIAGNOSIS — Z20828 Contact with and (suspected) exposure to other viral communicable diseases: Secondary | ICD-10-CM | POA: Diagnosis not present

## 2022-01-08 DIAGNOSIS — M25551 Pain in right hip: Secondary | ICD-10-CM | POA: Diagnosis not present

## 2022-01-08 DIAGNOSIS — M25552 Pain in left hip: Secondary | ICD-10-CM | POA: Diagnosis not present

## 2022-01-13 DIAGNOSIS — M5451 Vertebrogenic low back pain: Secondary | ICD-10-CM | POA: Diagnosis not present

## 2022-01-13 DIAGNOSIS — Z20822 Contact with and (suspected) exposure to covid-19: Secondary | ICD-10-CM | POA: Diagnosis not present

## 2022-01-13 DIAGNOSIS — M25552 Pain in left hip: Secondary | ICD-10-CM | POA: Diagnosis not present

## 2022-01-13 DIAGNOSIS — M25551 Pain in right hip: Secondary | ICD-10-CM | POA: Diagnosis not present

## 2022-01-15 DIAGNOSIS — M25552 Pain in left hip: Secondary | ICD-10-CM | POA: Diagnosis not present

## 2022-01-15 DIAGNOSIS — M25551 Pain in right hip: Secondary | ICD-10-CM | POA: Diagnosis not present

## 2022-01-15 DIAGNOSIS — M5451 Vertebrogenic low back pain: Secondary | ICD-10-CM | POA: Diagnosis not present

## 2022-01-19 ENCOUNTER — Other Ambulatory Visit: Payer: Self-pay | Admitting: Family Medicine

## 2022-01-20 DIAGNOSIS — M5451 Vertebrogenic low back pain: Secondary | ICD-10-CM | POA: Diagnosis not present

## 2022-01-20 DIAGNOSIS — M25551 Pain in right hip: Secondary | ICD-10-CM | POA: Diagnosis not present

## 2022-01-20 DIAGNOSIS — M25552 Pain in left hip: Secondary | ICD-10-CM | POA: Diagnosis not present

## 2022-01-22 DIAGNOSIS — M25551 Pain in right hip: Secondary | ICD-10-CM | POA: Diagnosis not present

## 2022-01-22 DIAGNOSIS — M25552 Pain in left hip: Secondary | ICD-10-CM | POA: Diagnosis not present

## 2022-01-22 DIAGNOSIS — M5451 Vertebrogenic low back pain: Secondary | ICD-10-CM | POA: Diagnosis not present

## 2022-01-23 DIAGNOSIS — Z20822 Contact with and (suspected) exposure to covid-19: Secondary | ICD-10-CM | POA: Diagnosis not present

## 2022-01-25 DIAGNOSIS — Z20822 Contact with and (suspected) exposure to covid-19: Secondary | ICD-10-CM | POA: Diagnosis not present

## 2022-01-27 DIAGNOSIS — M25551 Pain in right hip: Secondary | ICD-10-CM | POA: Diagnosis not present

## 2022-01-27 DIAGNOSIS — M25552 Pain in left hip: Secondary | ICD-10-CM | POA: Diagnosis not present

## 2022-01-27 DIAGNOSIS — M5451 Vertebrogenic low back pain: Secondary | ICD-10-CM | POA: Diagnosis not present

## 2022-01-29 DIAGNOSIS — M5451 Vertebrogenic low back pain: Secondary | ICD-10-CM | POA: Diagnosis not present

## 2022-01-29 DIAGNOSIS — M25552 Pain in left hip: Secondary | ICD-10-CM | POA: Diagnosis not present

## 2022-01-29 DIAGNOSIS — M25551 Pain in right hip: Secondary | ICD-10-CM | POA: Diagnosis not present

## 2022-02-03 ENCOUNTER — Ambulatory Visit (INDEPENDENT_AMBULATORY_CARE_PROVIDER_SITE_OTHER): Payer: Medicare Other | Admitting: Orthopaedic Surgery

## 2022-02-03 ENCOUNTER — Encounter: Payer: Self-pay | Admitting: Orthopaedic Surgery

## 2022-02-03 VITALS — BP 126/75 | HR 80 | Ht 68.5 in | Wt 220.0 lb

## 2022-02-03 DIAGNOSIS — M545 Low back pain, unspecified: Secondary | ICD-10-CM | POA: Diagnosis not present

## 2022-02-03 DIAGNOSIS — M4316 Spondylolisthesis, lumbar region: Secondary | ICD-10-CM

## 2022-02-03 DIAGNOSIS — G8929 Other chronic pain: Secondary | ICD-10-CM

## 2022-02-03 MED ORDER — PREDNISONE 5 MG (21) PO TBPK: ORAL_TABLET | ORAL | 0 refills | Status: DC

## 2022-02-03 NOTE — Progress Notes (Signed)
? ?Office Visit Note ?  ?Patient: Susan Beard           ?Date of Birth: 07-21-1951           ?MRN: [id number] ?Visit Date: 02/03/2022 ?             ?Requested by: Leamon Arnt, MD ?[address] ?[place],  [place] [address] ?PCP: Leamon Arnt, MD ? ? ?Assessment & Plan: ?Visit Diagnoses:  ?1. Chronic left-sided low back pain, unspecified whether sciatica present   ?2. Spondylolisthesis, lumbar region   ? ? ?Plan: We will proceed with a Lumbar MRI scan.  Patient's had sciatica symptoms off and on now with claudication symptoms and with her anterolisthesis 8 to 9 mm at both L4-5 and L5-S1 she likely has some central stenosis.  We discussed potential treatment with her progressive symptoms.  Follow-up after MRI scan. ? ?Follow-Up Instructions: No follow-ups on file.  ? ?Orders:  ?Orders Placed This Encounter  ?Procedures  ? MR Lumbar Spine w/o contrast  ? ?Meds ordered this encounter  ?Medications  ? predniSONE (STERAPRED UNI-PAK 21 TAB) 5 MG (21) TBPK tablet  ?  Sig: Take 6,5,4,3,2,1 one tablet less each day. Take with food.  ?  Dispense:  21 tablet  ?  Refill:  0  ? ? ? ? Procedures: ?No procedures performed ? ? ?Clinical Data: ?No additional findings. ? ? ?Subjective: ?Chief Complaint  ?Patient presents with  ? Lower Back - Follow-up, Pain  ? Left Hip - Follow-up, Pain  ? ? ?HPI 71 year old female returns for follow-up post physical therapy for continued back leg and hip pain.  Pains worse on the left than right.  Difficulty with walking is gotten somewhat better with therapy but still has back pain and leg pain.  She has tried ibuprofen.  Increased pain with standing problems with walking he has to stop sit and rest.  Plain radiograph showed 8 mm anterolisthesis at L4-5 and 9 mm at L5-S1 with degenerative facet changes.  Patient is on Fosamax.  PDMP score is 0. ? ?Patient's had progressive intermittent sciatica problems but states is now progressing and now has progressive claudication symptoms.   Previous right breast cancer with mastectomy.  All other systems are noncontributory.  Previous bone density showed some osteoporosis 2019. ? ?Review of Systems 14 point system noncontributory to HPI. ? ? ?Objective: ?Vital Signs: BP 126/75   Pulse 80   Ht 5' 8.5" (1.74 m)   Wt 220 lb (99.8 kg)   BMI 32.96 kg/m?  ? ?Physical Exam ?Constitutional:   ?   Appearance: She is well-developed.  ?HENT:  ?   Head: Normocephalic.  ?   Right Ear: External ear normal.  ?   Left Ear: External ear normal. There is no impacted cerumen.  ?Eyes:  ?   Pupils: Pupils are equal, round, and reactive to light.  ?Neck:  ?   Thyroid: No thyromegaly.  ?   Trachea: No tracheal deviation.  ?Cardiovascular:  ?   Rate and Rhythm: Normal rate.  ?Pulmonary:  ?   Effort: Pulmonary effort is normal.  ?Abdominal:  ?   Palpations: Abdomen is soft.  ?Musculoskeletal:  ?   Cervical back: No rigidity.  ?Skin: ?   General: Skin is warm and dry.  ?Neurological:  ?   Mental Status: She is alert and oriented to person, place, and time.  ?Psychiatric:     ?   Behavior: Behavior normal.  ? ? ?Ortho  Exam patient has a pain with straight leg raising right and left sciatic notch tenderness right and left.  She is wearing compression over lumbar region which tends to help her pain.  Mild trochanteric bursal tenderness negative logroll hips.  Positive straight leg raising worse on the left than right knee and ankle jerk are intact she is able to heel and toe walk. ? ?Specialty Comments:  ?No specialty comments available. ? ?Imaging: ?CLINICAL DATA:  Left sciatica ?  ?EXAM: ?LUMBAR SPINE - COMPLETE 4+ VIEW ?  ?COMPARISON:  None. ?  ?FINDINGS: ?Mild lumbar dextroscoliosis on the frontal view. Alignment ?demonstrates grade 1 anterolisthesis of L4 upon L5 measuring 8 mm ?and also at L5-S1 measuring 9 mm. ?  ?Oblique views are limited but no definite pars defects. Facet ?arthropathy also noted at L4-5 and L5-S1. No acute compression ?fracture, wedge-shaped  deformity or focal kyphosis. Normal SI joints ?for age. ?  ?Postop changes throughout the abdomen. Nonobstructive bowel gas ?pattern. ?  ?IMPRESSION: ?Lumbar dextroscoliosis and degenerative changes as above. ?  ?L4-5 and L5-S1 facet arthropathy with grade 1 anterolisthesis at ?both levels. ?  ?No acute compression fracture. ?  ?  ?Electronically Signed ?  By: Jerilynn Mages.  Shick M.D. ?  On: 03/27/2021 11:06 ? ? ?PMFS History: ?Patient Active Problem List  ? Diagnosis Date Noted  ? Spondylolisthesis, lumbar region 02/04/2022  ? Chronic pain of left knee 03/13/2019  ? Primary osteoarthritis of knees, bilateral 03/13/2019  ? Mixed hyperlipidemia 04/25/2018  ? Age-related osteoporosis without current pathological fracture 04/22/2016  ? History of right breast cancer 10/01/2015  ? Primary osteoarthritis of both hands 10/01/2015  ? ?Past Medical History:  ?Diagnosis Date  ? Age-related osteoporosis without current pathological fracture   ? Dexa Scan July 2017  ? Arthritis   ? Breast cancer ([place]) 1989  ? right  ? Primary osteoarthritis of knees, bilateral 03/13/2019  ?  ?Family History  ?Problem Relation Age of Onset  ? Arthritis Mother   ? Hyperlipidemia Mother   ? Osteoporosis Mother   ? Polymyalgia rheumatica Mother   ? Brain cancer Father   ? Hyperlipidemia Father   ? Healthy Sister   ? Diabetes Daughter   ? Healthy Son   ?  ?Past Surgical History:  ?Procedure Laterality Date  ? BREAST RECONSTRUCTION Right   ? MASTECTOMY MODIFIED RADICAL Right 1989  ? ?Social History  ? ?Occupational History  ? Occupation: Licensed Medical illustrator  ?  Employer: UNKNOWN  ?Tobacco Use  ? Smoking status: Never  ? Smokeless tobacco: Never  ?Vaping Use  ? Vaping Use: Never used  ?Substance and Sexual Activity  ? Alcohol use: No  ? Drug use: No  ? Sexual activity: Yes  ?  Birth control/protection: Post-menopausal  ? ? ? ? ? ? ?

## 2022-02-04 DIAGNOSIS — M4316 Spondylolisthesis, lumbar region: Secondary | ICD-10-CM | POA: Insufficient documentation

## 2022-02-06 DIAGNOSIS — Z20822 Contact with and (suspected) exposure to covid-19: Secondary | ICD-10-CM | POA: Diagnosis not present

## 2022-02-09 DIAGNOSIS — Z20822 Contact with and (suspected) exposure to covid-19: Secondary | ICD-10-CM | POA: Diagnosis not present

## 2022-02-23 ENCOUNTER — Ambulatory Visit
Admission: RE | Admit: 2022-02-23 | Discharge: 2022-02-23 | Disposition: A | Discharge status: Home / Self Care | Payer: Medicare Other | Source: Ambulatory Visit | Attending: Orthopaedic Surgery | Admitting: Orthopaedic Surgery

## 2022-02-23 DIAGNOSIS — M48061 Spinal stenosis, lumbar region without neurogenic claudication: Secondary | ICD-10-CM | POA: Diagnosis not present

## 2022-02-23 DIAGNOSIS — M4316 Spondylolisthesis, lumbar region: Secondary | ICD-10-CM | POA: Diagnosis not present

## 2022-02-23 DIAGNOSIS — M545 Low back pain, unspecified: Secondary | ICD-10-CM

## 2022-03-03 ENCOUNTER — Encounter: Payer: Self-pay | Admitting: Orthopaedic Surgery

## 2022-03-03 ENCOUNTER — Ambulatory Visit (INDEPENDENT_AMBULATORY_CARE_PROVIDER_SITE_OTHER): Payer: Medicare Other | Admitting: Orthopaedic Surgery

## 2022-03-03 VITALS — BP 143/78 | HR 78 | Ht 68.5 in | Wt 220.0 lb

## 2022-03-03 DIAGNOSIS — M4316 Spondylolisthesis, lumbar region: Secondary | ICD-10-CM | POA: Diagnosis not present

## 2022-03-03 NOTE — Progress Notes (Signed)
Office Visit Note   Patient: Susan Beard           Date of Birth: 31-Mar-1951           MRN: [id number] Visit Date: 03/03/2022              Requested by: Leamon Arnt, [place],  [place] [address] PCP: Leamon Arnt, MD   Assessment & Plan: Visit Diagnoses:  1. Spondylolisthesis, lumbar region     Plan: Return 2 months for lateral lumbar maximal flexion extension images.  (2 view) try to see how far she can walk before she has to stop.  Follow-Up Instructions: Return in about 2 months (around 05/03/2022).   Orders:  No orders of the defined types were placed in this encounter.  No orders of the defined types were placed in this encounter.     Procedures: No procedures performed   Clinical Data: No additional findings.   Subjective: Chief Complaint  Patient presents with   Lower Back - Follow-up, Pain    MRI review    HPI 71 year old female returns with claudication symptoms.  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  When she overdoes it and continues to push through she states sometimes it takes 1 or 2 hours before her back settles down again.  She does well when she gets up in the morning with walking.  She has anterolisthesis at L4-5 of 8 mm and at L5-S1 of 9 mm.  MRI scan is obtained and is available for review today.  We reviewed images I gave her a copy of the report.  Patient is on Fosamax not on any narcotic medication previous history of breast cancer with mastectomy.  Review of Systems all the systems noncontributory HPI.   Objective: Vital Signs: BP (!) 143/78   Pulse 78   Ht 5' 8.5" (1.74 m)   Wt 220 lb (99.8 kg)   BMI 32.96 kg/m   Physical Exam Constitutional:      Appearance: She is well-developed.  HENT:     Head: Normocephalic.     Right Ear: External ear normal.     Left Ear: External ear normal. There is no impacted cerumen.  Eyes:     Pupils: Pupils  are equal, round, and reactive to light.  Neck:     Thyroid: No thyromegaly.     Trachea: No tracheal deviation.  Cardiovascular:     Rate and Rhythm: Normal rate.  Pulmonary:     Effort: Pulmonary effort is normal.  Abdominal:     Palpations: Abdomen is soft.  Musculoskeletal:     Cervical back: No rigidity.  Skin:    General: Skin is warm and dry.  Neurological:     Mental Status: She is alert and oriented to person, place, and time.  Psychiatric:        Behavior: Behavior normal.    Ortho Exam normal gait speed and sequence.  No lower extremity atrophy.  More tenderness to the left sciatic notch with palpation.  Pain with straight leg raising 90 degrees.  Anterior tib gastrocsoleus is active.  Specialty Comments:  No specialty comments available.  Imaging: CLINICAL DATA:  Low back.   EXAM: MRI LUMBAR SPINE WITHOUT CONTRAST   TECHNIQUE: Multiplanar, multisequence MR imaging of the lumbar spine was performed. No intravenous contrast was administered.   COMPARISON:  None Available.   FINDINGS: Segmentation:  Standard.   Alignment: Mild grade anterolisthesis S1 secondary to facet disease. Minimal 1 anterolisthesis of L4 on L5 secondary facet disease   Vertebrae: No acute fracture, evidence of discitis, or aggressive bone lesion.   Conus medullaris and cauda equina: Conus extends to the T12-L1 level. Conus and cauda equina appear normal.   Paraspinal and other soft tissues: No acute paraspinal abnormality.   Disc levels:   Disc spaces: Degenerative disease with disc loss at S1 disc desiccation throughout lumbar spine mild disc height loss at T12-L1   T11-12: Mild broad-based disc bulge. Incompletely visualized right sided intraspinal the mm cystic structure adjacent the left facet joint likely reflecting synovial cyst mass on the exiting T11 nerve root.   T12-L1: Mild broad-based disc bulge. No foraminal or central stenosis. Mild bilateral facet  arthropathy.   L1-L2: Mild broad-based disc bulge. Mild bilateral facet arthropathy. No foraminal or central canal stenosis.   L2-L3: No significant disc bulge. Mild bilateral facet arthropathy. No foraminal or central canal stenosis.   L3-L4: Mild broad-based disc bulge. Moderate bilateral facet arthropathy. No foraminal or central canal stenosis.   L4-L5: Mild broad-based disc bulge. Severe bilateral facet arthropathy with a left facet effusion. No foraminal stenosis. Bilateral subarticular recess stenosis. No significant spinal stenosis.   L5-S1: No disc protrusion. No foraminal or central canal stenosis. Moderate bilateral facet arthropathy.   IMPRESSION: 1. At T11-12 there is a mild broad-based disc bulge. Incompletely visualized right sided intraspinal the mm cystic structure adjacent the left facet joint likely reflecting synovial cyst mass on the exiting T11 nerve root. 2. At L4-5 there is a mild broad-based disc bulge. Severe bilateral facet arthropathy with a left facet effusion. Bilateral subarticular recess stenosis.     Electronically Signed   By: Kathreen Devoid M.D.   On: 02/24/2022 10:36   PMFS History: Patient Active Problem List   Diagnosis Date Noted   Spondylolisthesis, lumbar region 02/04/2022   Chronic pain of left knee 03/13/2019   Primary osteoarthritis of knees, bilateral 03/13/2019   Mixed hyperlipidemia 04/25/2018   Age-related osteoporosis without current pathological fracture 04/22/2016   History of right breast cancer 10/01/2015   Primary osteoarthritis of both hands 10/01/2015   Past Medical History:  Diagnosis Date   Age-related osteoporosis without current pathological fracture    Dexa Scan July 2017   Arthritis    Breast cancer ([place]) 1989   right   Primary osteoarthritis of knees, bilateral 03/13/2019    Family History  Problem Relation Age of Onset   Arthritis Mother    Hyperlipidemia Mother    Osteoporosis Mother    Polymyalgia  rheumatica Mother    Brain cancer Father    Hyperlipidemia Father    Healthy Sister    Diabetes Daughter    Healthy Son     Past Surgical History:  Procedure Laterality Date   BREAST RECONSTRUCTION Right    MASTECTOMY MODIFIED RADICAL Right 1989   Social History   Occupational History   Occupation: Licensed Animator: UNKNOWN  Tobacco Use   Smoking status: Never   Smokeless tobacco: Never  Vaping Use   Vaping Use: Never used  Substance and Sexual Activity   Alcohol use: No   Drug use: No   Sexual activity: Yes    Birth control/protection: Post-menopausal

## 2022-03-30 ENCOUNTER — Ambulatory Visit (INDEPENDENT_AMBULATORY_CARE_PROVIDER_SITE_OTHER): Payer: Medicare Other | Admitting: Family Medicine

## 2022-03-30 ENCOUNTER — Encounter: Payer: Self-pay | Admitting: Family Medicine

## 2022-03-30 VITALS — BP 136/78 | HR 70 | Temp 97.9°F | Ht 68.5 in | Wt 220.8 lb

## 2022-03-30 DIAGNOSIS — E782 Mixed hyperlipidemia: Secondary | ICD-10-CM

## 2022-03-30 DIAGNOSIS — M4316 Spondylolisthesis, lumbar region: Secondary | ICD-10-CM

## 2022-03-30 DIAGNOSIS — Z1212 Encounter for screening for malignant neoplasm of rectum: Secondary | ICD-10-CM

## 2022-03-30 DIAGNOSIS — Z853 Personal history of malignant neoplasm of breast: Secondary | ICD-10-CM | POA: Diagnosis not present

## 2022-03-30 DIAGNOSIS — M810 Age-related osteoporosis without current pathological fracture: Secondary | ICD-10-CM | POA: Diagnosis not present

## 2022-03-30 DIAGNOSIS — Z1211 Encounter for screening for malignant neoplasm of colon: Secondary | ICD-10-CM | POA: Diagnosis not present

## 2022-03-30 LAB — CBC WITH DIFFERENTIAL/PLATELET
Basophils Absolute: 0.1 10*3/uL (ref 0.0–0.1)
Basophils Relative: 1.3 % (ref 0.0–3.0)
Eosinophils Absolute: 0.1 10*3/uL (ref 0.0–0.7)
Eosinophils Relative: 1.2 % (ref 0.0–5.0)
HCT: 40 % (ref 36.0–46.0)
Hemoglobin: 13.3 g/dL (ref 12.0–15.0)
Lymphocytes Relative: 34.4 % (ref 12.0–46.0)
Lymphs Abs: 1.7 10*3/uL (ref 0.7–4.0)
MCHC: 33.1 g/dL (ref 30.0–36.0)
MCV: 89.2 fl (ref 78.0–100.0)
Monocytes Absolute: 0.4 10*3/uL (ref 0.1–1.0)
Monocytes Relative: 7.6 % (ref 3.0–12.0)
Neutro Abs: 2.8 10*3/uL (ref 1.4–7.7)
Neutrophils Relative %: 55.5 % (ref 43.0–77.0)
Platelets: 197 10*3/uL (ref 150.0–400.0)
RBC: 4.49 Mil/uL (ref 3.87–5.11)
RDW: 14.4 % (ref 11.5–15.5)
WBC: 5 10*3/uL (ref 4.0–10.5)

## 2022-03-30 LAB — LIPID PANEL
Cholesterol: 203 mg/dL — ABNORMAL HIGH (ref 0–200)
HDL: 74.9 mg/dL (ref >39.00)
LDL Cholesterol: 104 mg/dL — ABNORMAL HIGH (ref 0–99)
NonHDL: 128.49
Total CHOL/HDL Ratio: 3
Triglycerides: 120 mg/dL (ref 0.0–149.0)
VLDL: 24 mg/dL (ref 0.0–40.0)

## 2022-03-30 LAB — COMPREHENSIVE METABOLIC PANEL
ALT: 25 U/L (ref 0–35)
AST: 22 U/L (ref 0–37)
Albumin: 4.2 g/dL (ref 3.5–5.2)
Alkaline Phosphatase: 57 U/L (ref 39–117)
BUN: 17 mg/dL (ref 6–23)
CO2: 27 mEq/L (ref 19–32)
Calcium: 9.2 mg/dL (ref 8.4–10.5)
Chloride: 105 mEq/L (ref 96–112)
Creatinine, Ser: 0.66 mg/dL (ref 0.40–1.20)
GFR: 87.68 mL/min (ref >60.00)
Glucose, Bld: 97 mg/dL (ref 70–99)
Potassium: 4.2 mEq/L (ref 3.5–5.1)
Sodium: 139 mEq/L (ref 135–145)
Total Bilirubin: 0.5 mg/dL (ref 0.2–1.2)
Total Protein: 7.1 g/dL (ref 6.0–8.3)

## 2022-03-30 MED ORDER — GABAPENTIN 100 MG PO CAPS: 100.0000 mg | ORAL_CAPSULE | Freq: Three times a day (TID) | ORAL | 3 refills | Status: DC | PRN

## 2022-04-03 NOTE — Progress Notes (Signed)
Please call patient: I have reviewed his/her lab results. Labs look good but would like cholesterol be be lower; please increase simvastatin to '20mg'$  nightly ; order #90 w/ 3 rf and notify pt. Want LDL < 100.  All other labs are fine.

## 2022-04-06 ENCOUNTER — Other Ambulatory Visit: Payer: Self-pay

## 2022-04-06 DIAGNOSIS — E782 Mixed hyperlipidemia: Secondary | ICD-10-CM

## 2022-04-06 MED ORDER — SIMVASTATIN 20 MG PO TABS: 20.0000 mg | ORAL_TABLET | Freq: Every day | ORAL | 3 refills | Status: DC

## 2022-05-05 ENCOUNTER — Ambulatory Visit: Payer: Medicare Other | Admitting: Orthopaedic Surgery

## 2022-05-12 ENCOUNTER — Encounter: Payer: Self-pay | Admitting: Family Medicine

## 2022-05-20 ENCOUNTER — Other Ambulatory Visit: Payer: Self-pay | Admitting: Family Medicine

## 2022-05-26 DIAGNOSIS — Z780 Asymptomatic menopausal state: Secondary | ICD-10-CM | POA: Diagnosis not present

## 2022-05-26 DIAGNOSIS — M8589 Other specified disorders of bone density and structure, multiple sites: Secondary | ICD-10-CM | POA: Diagnosis not present

## 2022-05-26 DIAGNOSIS — Z1231 Encounter for screening mammogram for malignant neoplasm of breast: Secondary | ICD-10-CM | POA: Diagnosis not present

## 2022-05-26 LAB — HM MAMMOGRAPHY

## 2022-05-26 LAB — HM DEXA SCAN

## 2022-05-28 ENCOUNTER — Encounter: Payer: Self-pay | Admitting: Family Medicine

## 2022-06-27 IMAGING — DX DG LUMBAR SPINE COMPLETE 4+V
5 series · 5 of 5 positions shown · non-contrast
Comparison: None.

CLINICAL DATA: Left sciatica

EXAM:
LUMBAR SPINE - COMPLETE 4+ VIEW

[lumbar spine ap]
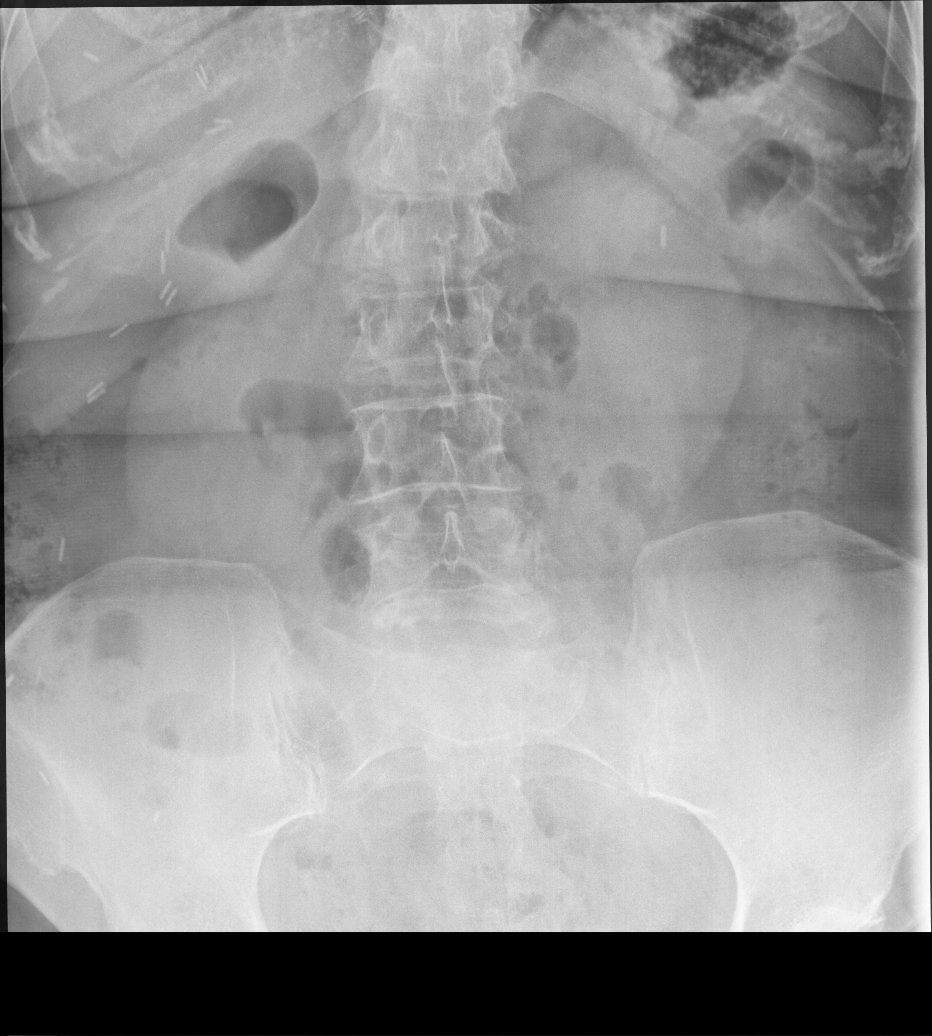

[lumbar spine oblique (1 of 2)]
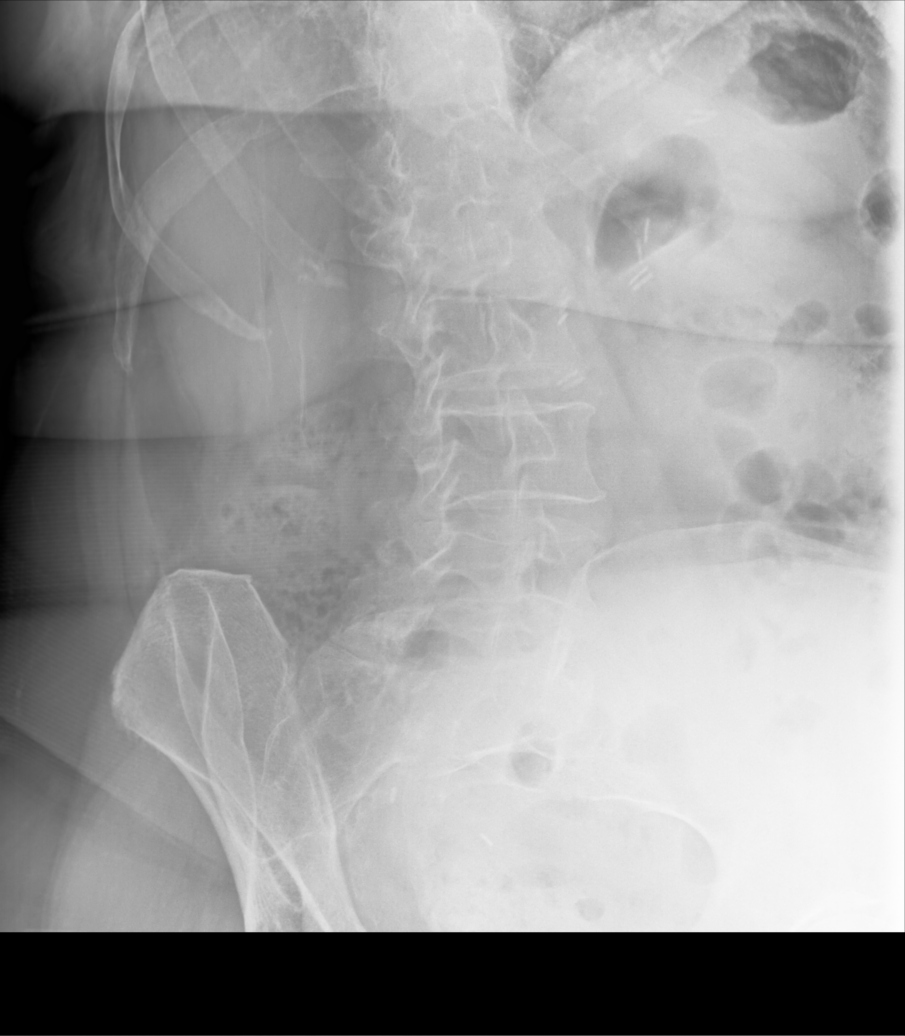

[lumbar spine oblique (2 of 2)]
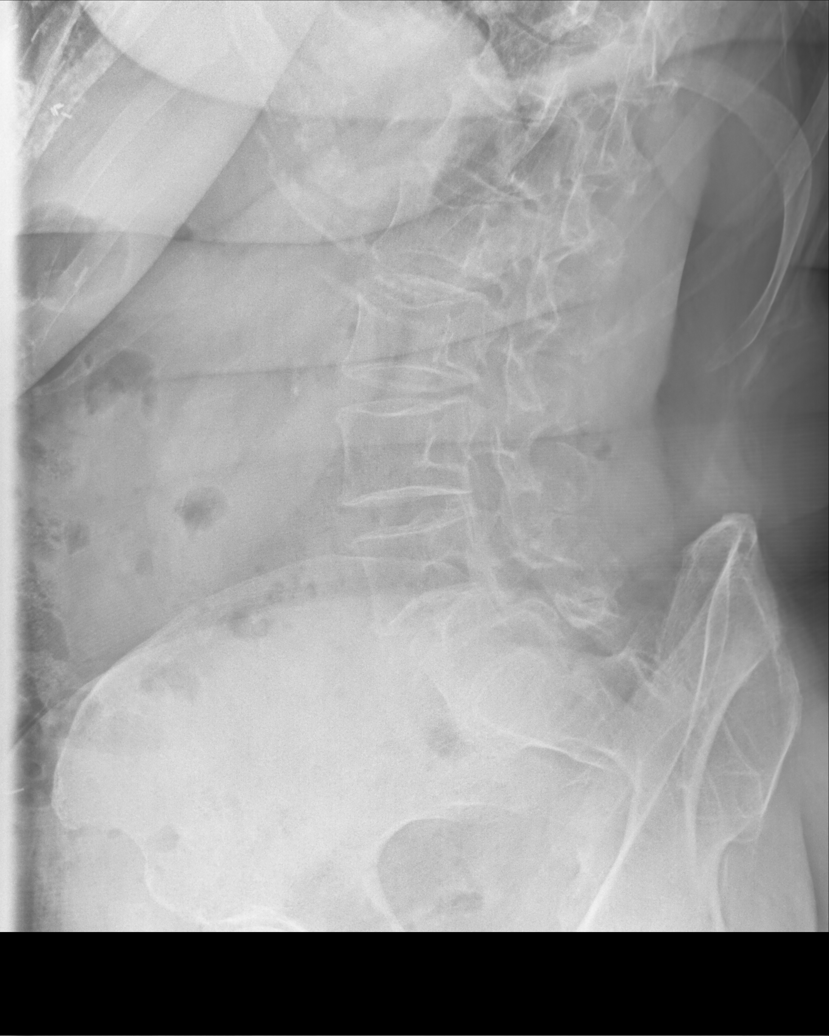

[lumbar spine lat (1 of 2)]
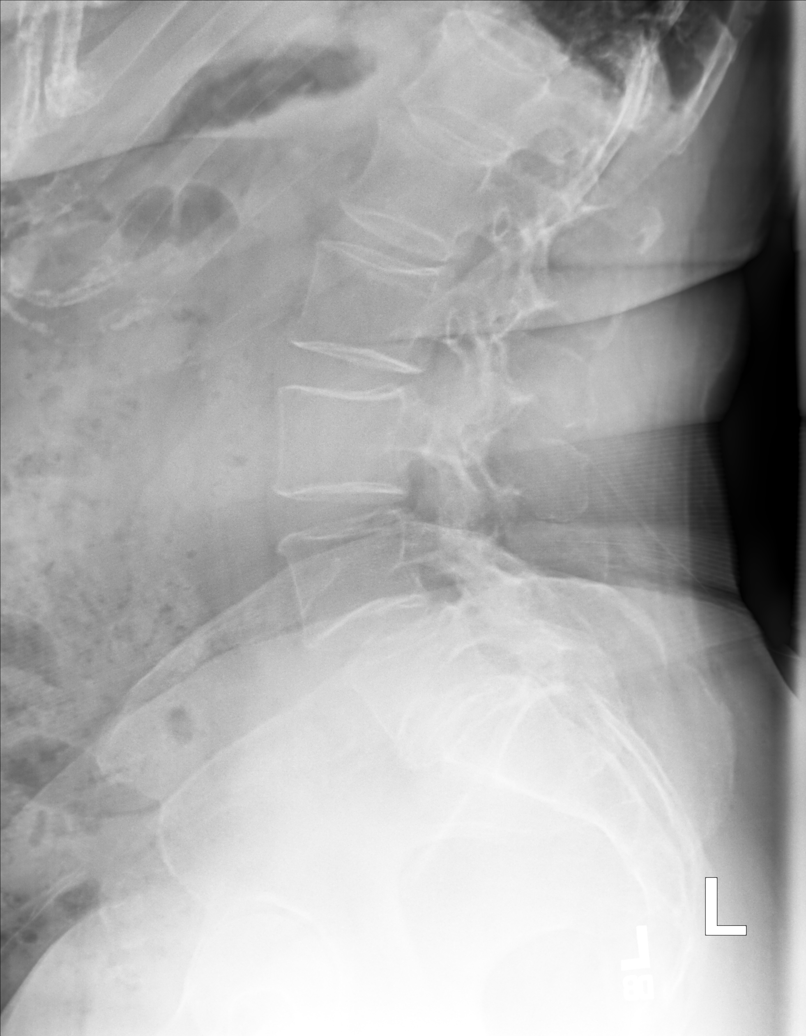

[lumbar spine lat (2 of 2)]
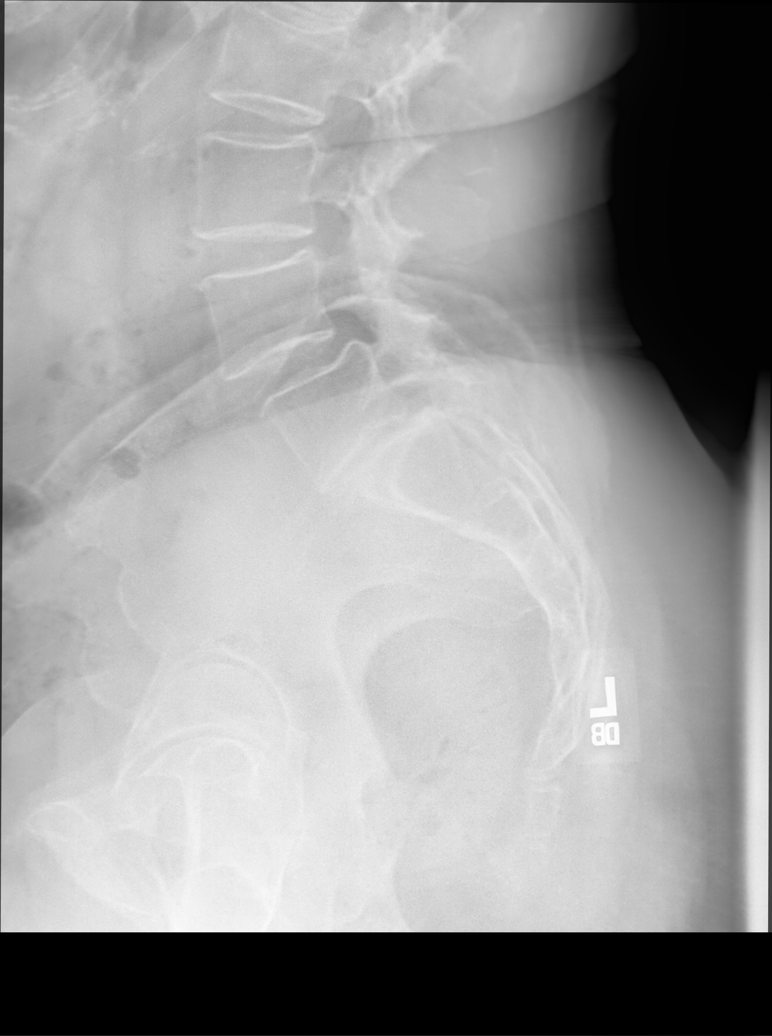

[5 of 5 positions shown; findings below may reference images not displayed]

FINDINGS: Mild lumbar dextroscoliosis on the frontal view. Alignment
demonstrates grade 1 anterolisthesis of L4 upon L5 measuring 8 mm
and also at L5-S1 measuring 9 mm.

Oblique views are limited but no definite pars defects. Facet
arthropathy also noted at L4-5 and L5-S1. No acute compression
fracture, wedge-shaped deformity or focal kyphosis. Normal SI joints
for age.

Postop changes throughout the abdomen. Nonobstructive bowel gas
pattern.
IMPRESSION: Lumbar dextroscoliosis and degenerative changes as above.

L4-5 and L5-S1 facet arthropathy with grade 1 anterolisthesis at
both levels.

No acute compression fracture.

## 2022-06-29 ENCOUNTER — Encounter: Payer: Self-pay | Admitting: *Deleted

## 2022-07-09 DIAGNOSIS — Z03818 Encounter for observation for suspected exposure to other biological agents ruled out: Secondary | ICD-10-CM | POA: Diagnosis not present

## 2022-07-16 DIAGNOSIS — Z1211 Encounter for screening for malignant neoplasm of colon: Secondary | ICD-10-CM | POA: Diagnosis not present

## 2022-07-16 DIAGNOSIS — Z1212 Encounter for screening for malignant neoplasm of rectum: Secondary | ICD-10-CM | POA: Diagnosis not present

## 2022-07-23 LAB — COLOGUARD: COLOGUARD: POSITIVE — AB

## 2022-07-27 ENCOUNTER — Other Ambulatory Visit: Payer: Self-pay

## 2022-07-27 DIAGNOSIS — R195 Other fecal abnormalities: Secondary | ICD-10-CM

## 2022-09-17 ENCOUNTER — Encounter: Payer: Self-pay | Admitting: *Deleted

## 2022-09-30 ENCOUNTER — Encounter: Payer: Self-pay | Admitting: Gastroenterology

## 2022-10-22 ENCOUNTER — Ambulatory Visit (AMBULATORY_SURGERY_CENTER): Payer: Medicare Other | Admitting: *Deleted

## 2022-10-22 ENCOUNTER — Telehealth: Payer: Self-pay | Admitting: *Deleted

## 2022-10-22 VITALS — Ht 68.0 in | Wt 213.0 lb

## 2022-10-22 DIAGNOSIS — Z1211 Encounter for screening for malignant neoplasm of colon: Secondary | ICD-10-CM

## 2022-10-22 MED ORDER — NA SULFATE-K SULFATE-MG SULF 17.5-3.13-1.6 GM/177ML PO SOLN: 1.0000 | Freq: Once | ORAL | 0 refills | Status: AC

## 2022-10-22 NOTE — Progress Notes (Signed)
No egg or soy allergy known to patient  No issues known to pt with past sedation with any surgeries or procedures Patient denies ever being told they had issues or difficulty with intubation  No FH of Malignant Hyperthermia Pt is not on diet pills Pt is not on  home 02  Pt is not on blood thinners  Pt denies issues with constipation  Pt is not on dialysis Pt denies any upcoming cardiac testing Pt encouraged to use to use Singlecare or Goodrx to reduce cost  Patient's chart reviewed by John Nulty CNRA prior to previsit and patient appropriate for the [facility].  Previsit completed and red dot placed by patient's name on their procedure day (on provider's schedule).  . Visit by phone Instructions sent by mail with coupon   

## 2022-10-22 NOTE — Telephone Encounter (Signed)
1st attempt: Used home # will call Cell #. LM with call back #

## 2022-11-05 ENCOUNTER — Ambulatory Visit (INDEPENDENT_AMBULATORY_CARE_PROVIDER_SITE_OTHER): Payer: Medicare Other

## 2022-11-05 VITALS — Wt 211.0 lb

## 2022-11-05 DIAGNOSIS — Z0000 Encounter for general adult medical examination without abnormal findings: Secondary | ICD-10-CM | POA: Diagnosis not present

## 2022-11-05 NOTE — Progress Notes (Signed)
I connected with  Susan Beard on 11/05/22 by a audio enabled telemedicine application and verified that I am speaking with the correct person using two identifiers.  Patient Location: Home  Provider Location: Office/Clinic  I discussed the limitations of evaluation and management by telemedicine. The patient expressed understanding and agreed to proceed.  Subjective:   Susan Beard is a 72 y.o. female who presents for Medicare Annual (Subsequent) preventive examination.  Review of Systems     Cardiac Risk Factors include: advanced age (>86mn, >>34women);obesity (BMI >30kg/m2);dyslipidemia     Objective:    Today's Vitals   11/05/22 0805  Weight: 211 lb (95.7 kg)   Body mass index is 32.08 kg/m.     11/05/2022    8:10 AM 10/30/2021    8:07 AM 10/24/2020    8:07 AM 10/03/2019    2:36 PM 09/02/2016    2:49 PM  Advanced Directives  Does Patient Have a Medical Advance Directive? Yes Yes Yes Yes No  Type of [place] Healthcare Power of [place] will Living will;Healthcare Power of Attorney   Does patient want to make changes to medical advance directive?    No - Patient declined   Copy of [place] Chart? No - copy requested No - copy requested No - copy requested No - copy requested   Would patient like information on creating a medical advance directive?     No - Patient declined    Current Medications (verified) Outpatient Encounter Medications as of 11/05/2022  Medication Sig   alendronate (FOSAMAX) 70 MG tablet TAKE ONE TABLET BY MOUTH ONCE WEEKLY   diphenhydrAMINE (BENADRYL) 50 MG tablet Take 50 mg by mouth at bedtime as needed for itching.   Multiple Vitamin (MULITIVITAMIN WITH MINERALS) TABS Take 1 tablet by mouth daily.   [DISCONTINUED] gabapentin (NEURONTIN) 100 MG capsule Take 1-3 capsules (100-300 mg total) by mouth 3 (three) times daily as needed. (Patient  not taking: Reported on 10/22/2022)   [DISCONTINUED] simvastatin (ZOCOR) 10 MG tablet TAKE ONE TABLET BY MOUTH EVERY NIGHT AT BEDTIME   [DISCONTINUED] simvastatin (ZOCOR) 20 MG tablet Take 1 tablet (20 mg total) by mouth at bedtime. (Patient not taking: Reported on 10/22/2022)   No facility-administered encounter medications on file as of 11/05/2022.    Allergies (verified) Codeine   History: Past Medical History:  Diagnosis Date   Age-related osteoporosis without current pathological fracture    Dexa Scan July 2017   Arthritis    [place]   Heart murmur    Primary osteoarthritis of knees, bilateral 03/13/2019   Past Surgical History:  Procedure Laterality Date   BREAST RECONSTRUCTION Right    MASTECTOMY MODIFIED RADICAL Right 1989   Family History  Problem Relation Age of Onset   Arthritis Mother    Hyperlipidemia Mother    Osteoporosis Mother    Polymyalgia rheumatica Mother    Brain cancer Father    Hyperlipidemia Father    Healthy Sister    Diabetes Daughter    Healthy Son    Colon cancer Neg Hx    Colon polyps Neg Hx    Esophageal cancer Neg Hx    Rectal cancer Neg Hx    Stomach cancer Neg Hx    Social History   Socioeconomic History   Marital status: Married    Spouse name: Not on file   Number of children: 2  Years of education: Not on file   Highest education level: Not on file  Occupational History   Occupation: Licensed Animator: UNKNOWN  Tobacco Use   Smoking status: Never   Smokeless tobacco: Never  Vaping Use   Vaping Use: Never used  Substance and Sexual Activity   Alcohol use: No   Drug use: No   Sexual activity: Yes    Birth control/protection: Post-menopausal  Other Topics Concern   Not on file  Social History Narrative   14 steps in home    Social Determinants of Health   Financial Resource Strain: Low Risk  (11/05/2022)   Overall Financial Resource Strain (CARDIA)    Difficulty of Paying  Living Expenses: Not hard at all  Food Insecurity: No Food Insecurity (11/05/2022)   Hunger Vital Sign    Worried About Running Out of Food in the Last Year: Never true    [place] in the Last Year: Never true  Transportation Needs: No Transportation Needs (11/05/2022)   PRAPARE - Hydrologist (Medical): No    Lack of Transportation (Non-Medical): No  Physical Activity: Inactive (11/05/2022)   Exercise Vital Sign    Days of Exercise per Week: 0 days    Minutes of Exercise per Session: 0 min  Stress: No Stress Concern Present (11/05/2022)   [place]    Feeling of Stress : Not at all  Social Connections: Moderately Integrated (11/05/2022)   Social Connection and Isolation Panel [NHANES]    Frequency of Communication with Friends and Family: More than three times a week    Frequency of Social Gatherings with Friends and Family: More than three times a week    Attends Religious Services: More than 4 times per year    Active Member of [employer] or Organizations: No    Attends Music therapist: Never    Marital Status: Married    Tobacco Counseling Counseling given: Not Answered   Clinical Intake:  Pre-visit preparation completed: Yes  Pain : No/denies pain     BMI - recorded: 32.08 Nutritional Status: BMI > 30  Obese Nutritional Risks: None Diabetes: No  How often do you need to have someone help you when you read instructions, pamphlets, or other written materials from your doctor or pharmacy?: 1 - Never  Diabetic?no  Interpreter Needed?: No  Information entered by :: Charlott Rakes, LPN   Activities of Daily Living    11/05/2022    8:11 AM  In your present state of health, do you have any difficulty performing the following activities:  Hearing? 0  Vision? 0  Difficulty concentrating or making decisions? 0  Walking or climbing stairs? 0  Dressing or bathing? 0   Doing errands, shopping? 0  Preparing Food and eating ? N  Using the Toilet? N  In the past six months, have you accidently leaked urine? N  Do you have problems with loss of bowel control? N  Managing your Medications? N  Managing your Finances? N  Housekeeping or managing your Housekeeping? N    Patient Care Team: Leamon Arnt, MD as PCP - General (Family Medicine) Eunice Blase, MD as Consulting Physician (Sports Medicine) Marybelle Killings, MD as Consulting Physician (Orthopedic Surgery)  Indicate any recent Medical Services you may have received from other than [facility] providers in the past year (date may be approximate).     Assessment:  This is a routine wellness examination for Susan Beard.  Hearing/Vision screen Hearing Screening - Comments:: Pt denies any hearing issues  Vision Screening - Comments:: Pt follows up with Dr Dorena Cookey for annual eye exams   Dietary issues and exercise activities discussed: Current Exercise Habits: The patient does not participate in regular exercise at present   Goals Addressed             This Visit's Progress    Patient Stated       Losing a few pounds        Depression Screen    11/05/2022    8:09 AM 03/30/2022    8:51 AM 10/30/2021    8:06 AM 10/24/2020    8:05 AM 10/03/2019    2:37 PM 03/13/2019   10:11 AM 01/20/2018    8:45 AM  PHQ 2/9 Scores  PHQ - 2 Score 0 0 0 0 0 0 0    Fall Risk    11/05/2022    8:10 AM 03/30/2022    8:51 AM 12/08/2021    3:00 PM 10/30/2021    8:08 AM 10/24/2020    8:08 AM  [place] in the past year? 0 0 0 0 1  Number falls in past yr: 0 0 0 0 1  Injury with Fall? 0 0 0 0 1  Comment     cracked tailbone  Risk for fall due to : Impaired vision No Fall Risks No Fall Risks Impaired vision Impaired vision;History of fall(s)  Risk for fall due to: Comment     slipped on ice  Follow up Falls prevention discussed Falls evaluation completed Falls evaluation completed Falls prevention discussed Falls  prevention discussed    FALL RISK PREVENTION PERTAINING TO THE HOME:  Any stairs in or around the home? Yes  If so, are there any without handrails? No  Home free of loose throw rugs in walkways, pet beds, electrical cords, etc? Yes  Adequate lighting in your home to reduce risk of falls? Yes   ASSISTIVE DEVICES UTILIZED TO PREVENT FALLS:  Life alert? No  Use of a cane, walker or w/c? No  Grab bars in the bathroom? Yes  Shower chair or bench in shower? Yes  Elevated toilet seat or a handicapped toilet? No   TIMED UP AND GO:  Was the test performed? No .  Cognitive Function:        11/05/2022    8:11 AM 10/30/2021    8:10 AM 10/24/2020    8:10 AM  6CIT Screen  What Year? 0 points 0 points 0 points  What month? 0 points 0 points 0 points  What time? 0 points 0 points   Count back from 20 0 points 0 points 0 points  Months in reverse 0 points 0 points 0 points  Repeat phrase 0 points 0 points 0 points  Total Score 0 points 0 points     Immunizations Immunization History  Administered Date(s) Administered   Fluad Quad(high Dose 65+) 06/11/2020   Influenza, High Dose Seasonal PF 06/16/2018, 06/23/2019   PFIZER(Purple Top)SARS-COV-2 Vaccination 12/10/2019, 01/10/2020   Pneumococcal Conjugate-13 10/16/2015   Pneumococcal Polysaccharide-23 12/18/2016   Tdap 09/30/2008   Zoster Recombinat (Shingrix) 06/16/2018, 11/11/2018   Zoster, Live 09/30/2005    TDAP status: Due, Education has been provided regarding the importance of this vaccine. Advised may receive this vaccine at local pharmacy or Health Dept. Aware to provide a copy of the vaccination record if obtained  from local pharmacy or Health Dept. Verbalized acceptance and understanding.  Flu Vaccine status: Due, Education has been provided regarding the importance of this vaccine. Advised may receive this vaccine at local pharmacy or Health Dept. Aware to provide a copy of the vaccination record if obtained from local  pharmacy or Health Dept. Verbalized acceptance and understanding.  Pneumococcal vaccine status: Up to date  Covid-19 vaccine status: Completed vaccines  Qualifies for Shingles Vaccine? Yes   Zostavax completed Yes   Shingrix Completed?: Yes  Screening Tests Health Maintenance  Topic Date Due   DTaP/Tdap/Td (2 - Td or Tdap) 09/30/2018   COVID-19 Vaccine (3 - Pfizer risk series) 02/07/2020   INFLUENZA VACCINE  05/05/2022   MAMMOGRAM  05/27/2023   Medicare Annual Wellness (AWV)  11/06/2023   DEXA SCAN  05/26/2024   Fecal DNA (Cologuard)  07/16/2025   Pneumonia Vaccine 64+ Years old  Completed   Hepatitis C Screening  Completed   Zoster Vaccines- Shingrix  Completed   HPV VACCINES  Aged Out    Health Maintenance  Health Maintenance Due  Topic Date Due   DTaP/Tdap/Td (2 - Td or Tdap) 09/30/2018   COVID-19 Vaccine (3 - Pfizer risk series) 02/07/2020   INFLUENZA VACCINE  05/05/2022    Colorectal cancer screening: Type of screening: Cologuard. Completed 07/16/22. Repeat every 3 years pt stated she has an upcoming appt for colonoscopy as well   Mammogram status: Completed 05/26/22. Repeat every year  Bone Density status: Completed 05/26/22. Results reflect: Bone density results: OSTEOPOROSIS. Repeat every 2 years.  Additional Screening:  Hepatitis C Screening:  Completed 01/20/18  Vision Screening: Recommended annual ophthalmology exams for early detection of glaucoma and other disorders of the eye. Is the patient up to date with their annual eye exam?  Yes  Who is the provider or what is the name of the office in which the patient attends annual eye exams? Dr Dorena Cookey  If pt is not established with a provider, would they like to be referred to a provider to establish care? No .   Dental Screening: Recommended annual dental exams for proper oral hygiene  Community Resource Referral / Chronic Care Management: CRR required this visit?  No   CCM required this visit?  No       Plan:     I have personally reviewed and noted the following in the patient's chart:   Medical and social history Use of alcohol, tobacco or illicit drugs  Current medications and supplements including opioid prescriptions. Patient is not currently taking opioid prescriptions. Functional ability and status Nutritional status Physical activity Advanced directives List of other physicians Hospitalizations, surgeries, and ER visits in previous 12 months Vitals Screenings to include cognitive, depression, and falls Referrals and appointments  In addition, I have reviewed and discussed with patient certain preventive protocols, quality metrics, and best practice recommendations. A written personalized care plan for preventive services as well as general preventive health recommendations were provided to patient.     Willette Brace, LPN   0/11/5850   Nurse Notes: none

## 2022-11-05 NOTE — Patient Instructions (Signed)
Susan Beard , Thank you for taking time to come for your Medicare Wellness Visit. I appreciate your ongoing commitment to your health goals. Please review the following plan we discussed and let me know if I can assist you in the future.   These are the goals we discussed:  Goals      Patient Stated     Lose weight     Patient Stated     None at this time      Patient Stated     Losing a few pounds         This is a list of the screening recommended for you and due dates:  Health Maintenance  Topic Date Due   DTaP/Tdap/Td vaccine (2 - Td or Tdap) 09/30/2018   COVID-19 Vaccine (3 - Pfizer risk series) 02/07/2020   Flu Shot  05/05/2022   Mammogram  05/27/2023   Medicare Annual Wellness Visit  11/06/2023   DEXA scan (bone density measurement)  05/26/2024   Cologuard (Stool DNA test)  07/16/2025   Pneumonia Vaccine  Completed   Hepatitis C Screening: USPSTF Recommendation to screen - Ages 70-79 yo.  Completed   Zoster (Shingles) Vaccine  Completed   HPV Vaccine  Aged Out    Advanced directives: Please bring a copy of your health care power of attorney and living will to the office at your convenience.  Conditions/risks identified:  lose a little weight   Next appointment: Follow up in one year for your annual wellness visit    Preventive Care 65 Years and Older, Female Preventive care refers to lifestyle choices and visits with your health care provider that can promote health and wellness. What does preventive care include? A yearly physical exam. This is also called an annual well check. Dental exams once or twice a year. Routine eye exams. Ask your health care provider how often you should have your eyes checked. Personal lifestyle choices, including: Daily care of your teeth and gums. Regular physical activity. Eating a healthy diet. Avoiding tobacco and drug use. Limiting alcohol use. Practicing safe sex. Taking low-dose aspirin every day. Taking vitamin and  mineral supplements as recommended by your health care provider. What happens during an annual well check? The services and screenings done by your health care provider during your annual well check will depend on your age, overall health, lifestyle risk factors, and family history of disease. Counseling  Your health care provider may ask you questions about your: Alcohol use. Tobacco use. Drug use. Emotional well-being. Home and relationship well-being. Sexual activity. Eating habits. History of falls. Memory and ability to understand (cognition). Work and work Statistician. Reproductive health. Screening  You may have the following tests or measurements: Height, weight, and BMI. Blood pressure. Lipid and cholesterol levels. These may be checked every 5 years, or more frequently if you are over 68 years old. Skin check. Lung cancer screening. You may have this screening every year starting at age 4 if you have a 30-pack-year history of smoking and currently smoke or have quit within the past 15 years. Fecal occult blood test (FOBT) of the stool. You may have this test every year starting at age 84. Flexible sigmoidoscopy or colonoscopy. You may have a sigmoidoscopy every 5 years or a colonoscopy every 10 years starting at age 72. Hepatitis C blood test. Hepatitis B blood test. Sexually transmitted disease (STD) testing. Diabetes screening. This is done by checking your blood sugar (glucose) after you have not eaten for  a while (fasting). You may have this done every 1-3 years. Bone density scan. This is done to screen for osteoporosis. You may have this done starting at age 56. Mammogram. This may be done every 1-2 years. Talk to your health care provider about how often you should have regular mammograms. Talk with your health care provider about your test results, treatment options, and if necessary, the need for more tests. Vaccines  Your health care provider may recommend  certain vaccines, such as: Influenza vaccine. This is recommended every year. Tetanus, diphtheria, and acellular pertussis (Tdap, Td) vaccine. You may need a Td booster every 10 years. Zoster vaccine. You may need this after age 39. Pneumococcal 13-valent conjugate (PCV13) vaccine. One dose is recommended after age 55. Pneumococcal polysaccharide (PPSV23) vaccine. One dose is recommended after age 61. Talk to your health care provider about which screenings and vaccines you need and how often you need them. This information is not intended to replace advice given to you by your health care provider. Make sure you discuss any questions you have with your health care provider. Document Released: 10/18/2015 Document Revised: 06/10/2016 Document Reviewed: 07/23/2015 Elsevier Interactive Patient Education  2017 [place] Prevention in the Home Falls can cause injuries. They can happen to people of all ages. There are many things you can do to make your home safe and to help prevent falls. What can I do on the outside of my home? Regularly fix the edges of walkways and driveways and fix any cracks. Remove anything that might make you trip as you walk through a door, such as a raised step or threshold. Trim any bushes or trees on the path to your home. Use bright outdoor lighting. Clear any walking paths of anything that might make someone trip, such as rocks or tools. Regularly check to see if handrails are loose or broken. Make sure that both sides of any steps have handrails. Any raised decks and porches should have guardrails on the edges. Have any leaves, snow, or ice cleared regularly. Use sand or salt on walking paths during winter. Clean up any spills in your garage right away. This includes oil or grease spills. What can I do in the bathroom? Use night lights. Install grab bars by the toilet and in the tub and shower. Do not use towel bars as grab bars. Use non-skid mats or  decals in the tub or shower. If you need to sit down in the shower, use a plastic, non-slip stool. Keep the floor dry. Clean up any water that spills on the floor as soon as it happens. Remove soap buildup in the tub or shower regularly. Attach bath mats securely with double-sided non-slip rug tape. Do not have throw rugs and other things on the floor that can make you trip. What can I do in the bedroom? Use night lights. Make sure that you have a light by your bed that is easy to reach. Do not use any sheets or blankets that are too big for your bed. They should not hang down onto the floor. Have a firm chair that has side arms. You can use this for support while you get dressed. Do not have throw rugs and other things on the floor that can make you trip. What can I do in the kitchen? Clean up any spills right away. Avoid walking on wet floors. Keep items that you use a lot in easy-to-reach places. If you need to reach something above  you, use a strong step stool that has a grab bar. Keep electrical cords out of the way. Do not use floor polish or wax that makes floors slippery. If you must use wax, use non-skid floor wax. Do not have throw rugs and other things on the floor that can make you trip. What can I do with my stairs? Do not leave any items on the stairs. Make sure that there are handrails on both sides of the stairs and use them. Fix handrails that are broken or loose. Make sure that handrails are as long as the stairways. Check any carpeting to make sure that it is firmly attached to the stairs. Fix any carpet that is loose or worn. Avoid having throw rugs at the top or bottom of the stairs. If you do have throw rugs, attach them to the floor with carpet tape. Make sure that you have a light switch at the top of the stairs and the bottom of the stairs. If you do not have them, ask someone to add them for you. What else can I do to help prevent falls? Wear shoes that: Do not  have high heels. Have rubber bottoms. Are comfortable and fit you well. Are closed at the toe. Do not wear sandals. If you use a stepladder: Make sure that it is fully opened. Do not climb a closed stepladder. Make sure that both sides of the stepladder are locked into place. Ask someone to hold it for you, if possible. Clearly mark and make sure that you can see: Any grab bars or handrails. First and last steps. Where the edge of each step is. Use tools that help you move around (mobility aids) if they are needed. These include: Canes. Walkers. Scooters. Crutches. Turn on the lights when you go into a dark area. Replace any light bulbs as soon as they burn out. Set up your furniture so you have a clear path. Avoid moving your furniture around. If any of your floors are uneven, fix them. If there are any pets around you, be aware of where they are. Review your medicines with your doctor. Some medicines can make you feel dizzy. This can increase your chance of falling. Ask your doctor what other things that you can do to help prevent falls. This information is not intended to replace advice given to you by your health care provider. Make sure you discuss any questions you have with your health care provider. Document Released: 07/18/2009 Document Revised: 02/27/2016 Document Reviewed: 10/26/2014 Elsevier Interactive Patient Education  2017 [employer].

## 2022-11-10 ENCOUNTER — Telehealth: Payer: Self-pay | Admitting: Gastroenterology

## 2022-11-10 DIAGNOSIS — Z1211 Encounter for screening for malignant neoplasm of colon: Secondary | ICD-10-CM

## 2022-11-10 MED ORDER — NA SULFATE-K SULFATE-MG SULF 17.5-3.13-1.6 GM/177ML PO SOLN: 1.0000 | Freq: Once | ORAL | 0 refills | Status: AC

## 2022-11-10 NOTE — Telephone Encounter (Signed)
Sent Rx for [facility] to [facility] in [place].  Phoned pt and made her aware.

## 2022-11-10 NOTE — Telephone Encounter (Signed)
Patient needs suprep sent to [facility] in [facility] for procedure on 2/8.

## 2022-11-12 ENCOUNTER — Encounter: Payer: Self-pay | Admitting: Gastroenterology

## 2022-11-12 ENCOUNTER — Ambulatory Visit (AMBULATORY_SURGERY_CENTER): Payer: Medicare Other | Admitting: Gastroenterology

## 2022-11-12 VITALS — BP 144/58 | HR 58 | Temp 98.2°F | Resp 14 | Ht 68.0 in | Wt 213.0 lb

## 2022-11-12 DIAGNOSIS — D123 Benign neoplasm of transverse colon: Secondary | ICD-10-CM

## 2022-11-12 DIAGNOSIS — Z1211 Encounter for screening for malignant neoplasm of colon: Secondary | ICD-10-CM

## 2022-11-12 DIAGNOSIS — R195 Other fecal abnormalities: Secondary | ICD-10-CM

## 2022-11-12 MED ORDER — SODIUM CHLORIDE 0.9 % IV SOLN: 500.0000 mL | Freq: Once | INTRAVENOUS | Status: DC

## 2022-11-12 NOTE — Progress Notes (Signed)
Pt's states no medical or surgical changes since previsit or office visit. 

## 2022-11-12 NOTE — Progress Notes (Signed)
Report given to PACU, vss 

## 2022-11-12 NOTE — Progress Notes (Signed)
Called to room to assist during endoscopic procedure.  Patient ID and intended procedure confirmed with present staff. Received instructions for my participation in the procedure from the performing physician.  

## 2022-11-12 NOTE — Op Note (Signed)
[facility] Patient Name: Susan Beard Procedure Date: 11/12/2022 12:11 PM MRN: [id number] Endoscopist: Mallie Mussel L. Loletha Carrow , MD, [id number] Age: 72 Referring MD:  Date of Birth: 10-22-1950 Gender: Female Account #: [id number] Procedure:                Colonoscopy Indications:              Positive Cologuard test Medicines:                Monitored Anesthesia Care Procedure:                Pre-Anesthesia Assessment:                           - Prior to the procedure, a History and Physical                            was performed, and patient medications and                            allergies were reviewed. The patient's tolerance of                            previous anesthesia was also reviewed. The risks                            and benefits of the procedure and the sedation                            options and risks were discussed with the patient.                            All questions were answered, and informed consent                            was obtained. Prior Anticoagulants: The patient has                            taken no anticoagulant or antiplatelet agents. ASA                            Grade Assessment: II - A patient with mild systemic                            disease. After reviewing the risks and benefits,                            the patient was deemed in satisfactory condition to                            undergo the procedure.                           After obtaining informed consent, the colonoscope  was passed under direct vision. Throughout the                            procedure, the patient's blood pressure, pulse, and                            oxygen saturations were monitored continuously. The                            CF [id number] #[id number] was introduced through the anus                            and advanced to the the terminal ileum, with                            identification of the appendiceal  orifice and IC                            valve. The colonoscopy was performed with                            difficulty due to a redundant colon and significant                            looping. Successful completion of the procedure was                            aided by changing the patient's position, using                            manual pressure and straightening and shortening                            the scope to obtain bowel loop reduction. The                            patient tolerated the procedure well. The quality                            of the bowel preparation was good. The terminal                            ileum, ileocecal valve, appendiceal orifice, and                            rectum were photographed. The bowel preparation                            used was SUPREP via split dose instruction. Scope In: 12:14:30 PM Scope Out: 12:38:06 PM Scope Withdrawal Time: 0 hours 16 minutes 0 seconds  Total Procedure Duration: 0 hours 23 minutes 36 seconds  Findings:                 The perianal and  digital rectal examinations were                            normal.                           The terminal ileum appeared normal.                           Repeat examination of right colon under NBI                            performed.                           A diminutive polyp was found in the transverse                            colon. The polyp was semi-sessile. The polyp was                            removed with a cold snare. Resection and retrieval                            were complete.                           Multiple diverticula were found in the left colon.                           The exam was otherwise without abnormality on                            direct and retroflexion views. Complications:            No immediate complications. Estimated Blood Loss:     Estimated blood loss was minimal. Impression:               - The examined portion of  the ileum was normal.                           - One diminutive polyp in the transverse colon,                            removed with a cold snare. Resected and retrieved.                           - Diverticulosis in the left colon.                           - The examination was otherwise normal on direct                            and retroflexion views. Recommendation:           - Patient has a contact number available for  emergencies. The signs and symptoms of potential                            delayed complications were discussed with the                            patient. Return to normal activities tomorrow.                            Written discharge instructions were provided to the                            patient.                           - Resume previous diet.                           - Continue present medications.                           - Await pathology results.                           - Repeat colonoscopy may be recommended for                            surveillance (if polyp is a TA or SSP). That will                            be determined after pathology results from today's                            exam become available for review. Tayana Shankle L. Loletha Carrow, MD 11/12/2022 12:43:32 PM This report has been signed electronically.

## 2022-11-12 NOTE — Patient Instructions (Addendum)
  Resume previous diet. Continue present medications. Await pathology results.  YOU HAD AN ENDOSCOPIC PROCEDURE TODAY: Refer to the procedure report and other information in the discharge instructions given to you for any specific questions about what was found during the examination. If this information does not answer your questions, please call [place] office at [phone] to clarify.   YOU SHOULD EXPECT: Some feelings of bloating in the abdomen. Passage of more gas than usual. Walking can help get rid of the air that was put into your GI tract during the procedure and reduce the bloating. If you had a lower endoscopy (such as a colonoscopy or flexible sigmoidoscopy) you may notice spotting of blood in your stool or on the toilet paper. Some abdominal soreness may be present for a day or two, also.  DIET: Your first meal following the procedure should be a light meal and then it is ok to progress to your normal diet. A half-sandwich or bowl of soup is an example of a good first meal. Heavy or fried foods are harder to digest and may make you feel nauseous or bloated. Drink plenty of fluids but you should avoid alcoholic beverages for 24 hours. If you had a esophageal dilation, please see attached instructions for diet.    ACTIVITY: Your care partner should take you home directly after the procedure. You should plan to take it easy, moving slowly for the rest of the day. You can resume normal activity the day after the procedure however YOU SHOULD NOT DRIVE, use power tools, machinery or perform tasks that involve climbing or major physical exertion for 24 hours (because of the sedation medicines used during the test).   SYMPTOMS TO REPORT IMMEDIATELY: A gastroenterologist can be reached at any hour. Please call [phone]  for any of the following symptoms:  Following lower endoscopy (colonoscopy, flexible sigmoidoscopy) Excessive amounts of blood in the stool  Significant tenderness,  worsening of abdominal pains  Swelling of the abdomen that is new, acute  Fever of 100 or higher   FOLLOW UP:  If any biopsies were taken you will be contacted by phone or by letter within the next 1-3 weeks. Call [phone]  if you have not heard about the biopsies in 3 weeks.  Please also call with any specific questions about appointments or follow up tests.

## 2022-11-12 NOTE — Progress Notes (Signed)
History and Physical:  This patient presents for endoscopic testing for: Encounter Diagnosis  Name Primary?   Positive colorectal cancer screening using Cologuard test Yes    Positive test Oct 2023 Patient denies chronic abdominal pain, rectal bleeding, constipation or diarrhea. Last colonoscopy about 19 yrs ago at outside practice (patient recollection)  Patient is otherwise without complaints or active issues today.   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     Multiple Vitamin (MULITIVITAMIN WITH MINERALS) TABS Take 1 tablet by mouth daily.     alendronate (FOSAMAX) 70 MG tablet TAKE ONE TABLET BY MOUTH ONCE WEEKLY 12 tablet 3   Current Facility-Administered Medications  Medication Dose Route Frequency Provider Last Rate Last Admin   0.9 %  sodium chloride infusion  500 mL Intravenous Once Doran Stabler, MD          ___________________________________________________________________ Objective   Exam:  BP (!) 147/83   Pulse 67   Temp 98.2 F (36.8 C)   Resp 16   Ht '5\' 8"'$  (1.727 m)   Wt 213 lb (96.6 kg)   SpO2 98%   BMI 32.39 kg/m   CV: regular , S1/S2 Resp: clear to auscultation bilaterally, normal RR and effort noted GI: soft, no tenderness, with active bowel sounds.   Assessment: Encounter Diagnosis  Name Primary?    Positive colorectal cancer screening using Cologuard test Yes     Plan: Colonoscopy  The benefits and risks of the planned procedure were described in detail with the patient or (when appropriate) their health care proxy.  Risks were outlined as including, but not limited to, bleeding, infection, perforation, adverse medication reaction leading to cardiac or pulmonary decompensation, pancreatitis (if ERCP).  The limitation of incomplete mucosal visualization was also discussed.  No guarantees or warranties were given.    The patient is appropriate for an endoscopic procedure in the ambulatory setting.   - Wilfrid Lund, MD

## 2022-11-13 ENCOUNTER — Telehealth: Payer: Self-pay | Admitting: *Deleted

## 2022-11-13 NOTE — Telephone Encounter (Signed)
Left message on f/u call 

## 2022-11-17 ENCOUNTER — Encounter: Payer: Self-pay | Admitting: Gastroenterology

## 2022-12-22 ENCOUNTER — Other Ambulatory Visit: Payer: Self-pay | Admitting: Family Medicine

## 2023-04-01 ENCOUNTER — Encounter: Payer: Self-pay | Admitting: Family Medicine

## 2023-04-01 ENCOUNTER — Ambulatory Visit (INDEPENDENT_AMBULATORY_CARE_PROVIDER_SITE_OTHER): Payer: Medicare Other | Admitting: Family Medicine

## 2023-04-01 VITALS — BP 124/64 | HR 68 | Temp 98.2°F | Ht 68.0 in | Wt 204.8 lb

## 2023-04-01 DIAGNOSIS — E782 Mixed hyperlipidemia: Secondary | ICD-10-CM | POA: Diagnosis not present

## 2023-04-01 DIAGNOSIS — D126 Benign neoplasm of colon, unspecified: Secondary | ICD-10-CM

## 2023-04-01 DIAGNOSIS — M810 Age-related osteoporosis without current pathological fracture: Secondary | ICD-10-CM

## 2023-04-01 DIAGNOSIS — M4316 Spondylolisthesis, lumbar region: Secondary | ICD-10-CM | POA: Diagnosis not present

## 2023-04-01 LAB — CBC WITH DIFFERENTIAL/PLATELET
Basophils Absolute: 0.1 10*3/uL (ref 0.0–0.1)
Basophils Relative: 1.2 % (ref 0.0–3.0)
Eosinophils Absolute: 0 10*3/uL (ref 0.0–0.7)
Eosinophils Relative: 0.8 % (ref 0.0–5.0)
HCT: 39.1 % (ref 36.0–46.0)
Hemoglobin: 12.7 g/dL (ref 12.0–15.0)
Lymphocytes Relative: 37.3 % (ref 12.0–46.0)
Lymphs Abs: 2 10*3/uL (ref 0.7–4.0)
MCHC: 32.5 g/dL (ref 30.0–36.0)
MCV: 89.7 fl (ref 78.0–100.0)
Monocytes Absolute: 0.4 10*3/uL (ref 0.1–1.0)
Monocytes Relative: 7.4 % (ref 3.0–12.0)
Neutro Abs: 2.8 10*3/uL (ref 1.4–7.7)
Neutrophils Relative %: 53.3 % (ref 43.0–77.0)
Platelets: 205 10*3/uL (ref 150.0–400.0)
RBC: 4.35 Mil/uL (ref 3.87–5.11)
RDW: 14.7 % (ref 11.5–15.5)
WBC: 5.3 10*3/uL (ref 4.0–10.5)

## 2023-04-01 LAB — COMPREHENSIVE METABOLIC PANEL
ALT: 19 U/L (ref 0–35)
AST: 17 U/L (ref 0–37)
Albumin: 4.1 g/dL (ref 3.5–5.2)
Alkaline Phosphatase: 63 U/L (ref 39–117)
BUN: 18 mg/dL (ref 6–23)
CO2: 27 mEq/L (ref 19–32)
Calcium: 9 mg/dL (ref 8.4–10.5)
Chloride: 107 mEq/L (ref 96–112)
Creatinine, Ser: 0.64 mg/dL (ref 0.40–1.20)
GFR: 87.71 mL/min (ref >60.00)
Glucose, Bld: 91 mg/dL (ref 70–99)
Potassium: 4.2 mEq/L (ref 3.5–5.1)
Sodium: 140 mEq/L (ref 135–145)
Total Bilirubin: 0.5 mg/dL (ref 0.2–1.2)
Total Protein: 6.7 g/dL (ref 6.0–8.3)

## 2023-04-01 LAB — LIPID PANEL
Cholesterol: 242 mg/dL — ABNORMAL HIGH (ref 0–200)
HDL: 66.6 mg/dL (ref >39.00)
LDL Cholesterol: 152 mg/dL — ABNORMAL HIGH (ref 0–99)
NonHDL: 175.81
Total CHOL/HDL Ratio: 4
Triglycerides: 118 mg/dL (ref 0.0–149.0)
VLDL: 23.6 mg/dL (ref 0.0–40.0)

## 2023-04-01 LAB — TSH: TSH: 1.13 u[IU]/mL (ref 0.35–5.50)

## 2023-04-01 MED ORDER — ROSUVASTATIN CALCIUM 10 MG PO TABS: 10.0000 mg | ORAL_TABLET | Freq: Every day | ORAL | 3 refills | Status: DC

## 2023-04-01 NOTE — Progress Notes (Signed)
Subjective  Chief Complaint  Patient presents with   Annual Exam    Pt here for Annual Exam and is currently     HPI: Susan Beard is a 72 y.o. female who presents to [facility] [place] today for a Female Wellness Visit. She also has the concerns and/or needs as listed above in the chief complaint. These will be addressed in addition to the Health Maintenance Visit.   Wellness Visit: annual visit with health maintenance review and exam without Pap  HM: Mammogram current.  Reviewed positive Cologuard last year, had colonoscopy in February, tubular adenoma with recommended repeat in 7 years with Dr. Myrtie Neither.  Immunizations are current.  Eye exam current.  Overall stable. Chronic disease f/u and/or acute problem visit: (deemed necessary to be done in addition to the wellness visit): Chronic back pain: Dr. Otelia Sergeant had recommended lumbar surgery for herniated disc but patient defers.  Lives with chronic low back pain.  No radicular symptoms now. Hyperlipidemia: Has been on simvastatin for several years, when we increased to 20 mg she noticed some loose stools.  Has not been taking for several months. Osteoporosis: Reviewed bone density from August.  Stable.  Has been on Fosamax since 2017.  Positive family history.  No fractures.  Assessment  1. Tubular adenoma of colon   2. Age-related osteoporosis without current pathological fracture   3. Mixed hyperlipidemia   4. Spondylolisthesis, lumbar region      Plan  Female Wellness Visit: Age appropriate Health Maintenance and Prevention measures were discussed with patient. Included topics are cancer screening recommendations, ways to keep healthy (see AVS) including dietary and exercise recommendations, regular eye and dental care, use of seat belts, and avoidance of moderate alcohol use and tobacco use.  BMI: discussed patient's BMI and encouraged positive lifestyle modifications to help get to or maintain a target BMI. HM  needs and immunizations were addressed and ordered. See below for orders. See HM and immunization section for updates. Routine labs and screening tests ordered including cmp, cbc and lipids where appropriate. Discussed recommendations regarding Vit D and calcium supplementation (see AVS)  Chronic disease management visit and/or acute problem visit: Osteoporosis: DEXA [facility]is 05/2022: no comparison due to new imaging: lowest T = -2.4 at spine on fosamax since 2017: rec drug holiday and recheck 2 years. Then prolia or evenity.  Tubular adenoma: Discussed follow-up recommendations Hyperlipidemia: Has history of very elevated LDL, highest 180.  Start Crestor 10 mg nightly to see how she tolerates it.  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  Follows with orthopedics.  Stable per her report  Follow up: 12 months for complete physical Orders Placed This Encounter  Procedures   CBC with Differential/Platelet   Comprehensive metabolic panel   Lipid panel   TSH   Meds ordered this encounter  Medications   rosuvastatin (CRESTOR) 10 MG tablet    Sig: Take 1 tablet (10 mg total) by mouth daily.    Dispense:  90 tablet    Refill:  3      Body mass index is 31.14 kg/m. Wt Readings from Last 3 Encounters:  04/01/23 204 lb 12.8 oz (92.9 kg)  11/12/22 213 lb (96.6 kg)  11/05/22 211 lb (95.7 kg)     Patient Active Problem List   Diagnosis Date Noted   Tubular adenoma of colon 04/01/2023    Priority: High    Dr. Myrtie Neither, colonoscopy after positive  cologuard 12/02/2022; repeat colonoscopy in 7 years    Spondylolisthesis, lumbar region 02/04/2022    Priority: High    Dr. Ophelia Charter, ortho. MRI. 2023 Trial of gabapentin 03/2022 initiated    Mixed hyperlipidemia 04/25/2018    Priority: High   Age-related osteoporosis without current pathological fracture 04/22/2016    Priority: High    DEXA [facility] 05/2022: no comparison due to new imaging: lowest T = -2.4 at  spine on fosamax since 2017: rec drug holiday and recheck 2 years. Then prolia or evenity.  DEXA 03/2020: mildly improving osteoporosis/osteopenia; continue fosamax. DEXA 04/2018: stable osteoporosis; continue fosamax.  DEXA scan July 2017: Hip T equals -2.8, spine equals -2.5; discussed osteoporosis medications and started fosamax weekly     History of right breast cancer 10/01/2015    Priority: High    Modified radical mastectomy with reconstruction    Chronic pain of left knee 03/13/2019    Priority: Medium    Primary osteoarthritis of knees, bilateral 03/13/2019    Priority: Medium     Right knee responded to steroid injection 2019; left knee with djd changes in medial and patellofemoral areas on xrays 07/2018. No response to steroid injection at that time.  SM referral 03/2019    Primary osteoarthritis of both hands 10/01/2015    Priority: Medium    Health Maintenance  Topic Date Due   COVID-19 Vaccine (3 - Pfizer risk series) 04/17/2023 (Originally 02/07/2020)   INFLUENZA VACCINE  05/06/2023   MAMMOGRAM  05/27/2023   Medicare Annual Wellness (AWV)  11/06/2023   DEXA SCAN  05/26/2024   Colonoscopy  11/12/2029   Pneumonia Vaccine 20+ Years old  Completed   Hepatitis C Screening  Completed   Zoster Vaccines- Shingrix  Completed   HPV VACCINES  Aged Out   DTaP/Tdap/Td  Discontinued   Fecal DNA (Cologuard)  Discontinued   Immunization History  Administered Date(s) Administered   Fluad Quad(high Dose 65+) 06/11/2020   Influenza, High Dose Seasonal PF 06/16/2018, 06/23/2019   PFIZER(Purple Top)SARS-COV-2 Vaccination 12/10/2019, 01/10/2020   Pneumococcal Conjugate-13 10/16/2015   Pneumococcal Polysaccharide-23 12/18/2016   Tdap 09/30/2008   Zoster Recombinat (Shingrix) 06/16/2018, 11/11/2018   Zoster, Live 09/30/2005   We updated and reviewed the patient's past history in detail and it is documented below. Allergies: Patient is allergic to codeine. Past Medical  History Patient  has a past medical history of Age-related osteoporosis without current pathological fracture, Arthritis, Breast cancer (HCC) (1989), Heart murmur, and Primary osteoarthritis of knees, bilateral (03/13/2019). Past Surgical History Patient  has a past surgical history that includes Mastectomy modified radical (Right, 1989) and Breast reconstruction (Right). Family History: Patient family history includes Arthritis in her mother; Brain cancer in her father; Diabetes in her daughter; Healthy in her sister and son; Hyperlipidemia in her father and mother; Osteoporosis in her mother; Polymyalgia rheumatica in her mother. Social History:  Patient  reports that she has never smoked. She has never used smokeless tobacco. She reports that she does not currently use alcohol. She reports that she does not use drugs.  Review of Systems: Constitutional: negative for fever or malaise Ophthalmic: negative for photophobia, double vision or loss of vision Cardiovascular: negative for chest pain, dyspnea on exertion, or new LE swelling Respiratory: negative for SOB or persistent cough Gastrointestinal: negative for abdominal pain, change in bowel habits or melena Genitourinary: negative for dysuria or gross hematuria, no abnormal uterine bleeding or disharge Musculoskeletal: negative for new gait disturbance or muscular weakness Integumentary: negative for  new or persistent rashes, no breast lumps Neurological: negative for TIA or stroke symptoms Psychiatric: negative for SI or delusions Allergic/Immunologic: negative for hives  Patient Care Team    Relationship Specialty Notifications Start End  Willow Ora, MD PCP - General Family Medicine  02/23/22   Hilts, Casimiro Needle, MD Consulting Physician Sports Medicine  10/03/19   Eldred Manges, MD Consulting Physician Orthopedic Surgery  03/30/22     Objective  Vitals: BP 124/64   Pulse 68   Temp 98.2 F (36.8 C)   Ht 5\' 8"  (1.727 m)   Wt  204 lb 12.8 oz (92.9 kg)   SpO2 99%   BMI 31.14 kg/m  General:  Well developed, well nourished, no acute distress  Psych:  Alert and orientedx3,normal mood and affect HEENT:  Normocephalic, atraumatic, non-icteric sclera,  supple neck without adenopathy, mass or thyromegaly Cardiovascular:  Normal S1, S2, RRR without gallop, rub or murmur Respiratory:  Good breath sounds bilaterally, CTAB with normal respiratory effort Gastrointestinal: normal bowel sounds, soft, non-tender, no noted masses. No HSM MSK: extremities without edema, joints without erythema or swelling Neurologic:    Mental status is normal.  Gross motor and sensory exams are normal.  No tremor  Commons side effects, risks, benefits, and alternatives for medications and treatment plan prescribed today were discussed, and the patient expressed understanding of the given instructions. Patient is instructed to call or message via MyChart if he/she has any questions or concerns regarding our treatment plan. No barriers to understanding were identified. We discussed Red Flag symptoms and signs in detail. Patient expressed understanding regarding what to do in case of urgent or emergency type symptoms.  Medication list was reconciled, printed and provided to the patient in AVS. Patient instructions and summary information was reviewed with the patient as documented in the AVS. This note was prepared with assistance of Dragon voice recognition software. Occasional wrong-word or sound-a-like substitutions may have occurred due to the inherent limitations of voice recognition software

## 2023-04-01 NOTE — Patient Instructions (Addendum)
Please return in 12 months for your annual complete physical; please come fasting.   I will release your lab results to you on your MyChart account with further instructions. You may see the results before I do, but when I review them I will send you a message with my report or have my assistant call you if things need to be discussed. Please reply to my message with any questions. Thank you! 12 months for your annual complete physical; please come fasting.   If you have any questions or concerns, please don't hesitate to send me a message via MyChart or call the office at [phone]. Thank you for visiting with [place] today! It's our pleasure caring for you.

## 2023-04-02 DIAGNOSIS — M25562 Pain in left knee: Secondary | ICD-10-CM | POA: Diagnosis not present

## 2023-04-02 DIAGNOSIS — R262 Difficulty in walking, not elsewhere classified: Secondary | ICD-10-CM | POA: Diagnosis not present

## 2023-04-02 DIAGNOSIS — M25561 Pain in right knee: Secondary | ICD-10-CM | POA: Diagnosis not present

## 2023-04-02 DIAGNOSIS — M170 Bilateral primary osteoarthritis of knee: Secondary | ICD-10-CM | POA: Diagnosis not present

## 2023-04-05 NOTE — Progress Notes (Signed)
Labs reviewed.  The 10-year ASCVD risk score (Arnett DK, et al., 2019) is: 12.5%, failed simvastin, starting crestor   Values used to calculate the score:     Age: 72 years     Sex: Female     Is Non-Hispanic African American: No     Diabetic: No     Tobacco smoker: No     Systolic Blood Pressure: 124 mmHg     Is BP treated: No     HDL Cholesterol: 66.6 mg/dL     Total Cholesterol: 242 mg/dL  Dear Susan Beard, Thank you for allowing me to care for you at your recent office visit.  I wanted to let you know that I have reviewed your lab test results and am happy to report that they are mostly normal.  Your cholesterol is elevated again as expected. Let me know if you have any problems taking the rosuvastatin. No other changes are needed at this time.  I recommend a follow up visit to recheck cholesterol and liver tests in 8 weeks.   Sincerely, Dr. Mardelle Matte

## 2023-05-12 DIAGNOSIS — M25561 Pain in right knee: Secondary | ICD-10-CM | POA: Diagnosis not present

## 2023-05-12 DIAGNOSIS — M25562 Pain in left knee: Secondary | ICD-10-CM | POA: Diagnosis not present

## 2023-05-12 DIAGNOSIS — M170 Bilateral primary osteoarthritis of knee: Secondary | ICD-10-CM | POA: Diagnosis not present

## 2023-05-12 DIAGNOSIS — R262 Difficulty in walking, not elsewhere classified: Secondary | ICD-10-CM | POA: Diagnosis not present

## 2023-05-16 ENCOUNTER — Other Ambulatory Visit: Payer: Self-pay | Admitting: Family Medicine

## 2023-05-17 DIAGNOSIS — L720 Epidermal cyst: Secondary | ICD-10-CM | POA: Diagnosis not present

## 2023-05-19 DIAGNOSIS — M25561 Pain in right knee: Secondary | ICD-10-CM | POA: Diagnosis not present

## 2023-05-19 DIAGNOSIS — R262 Difficulty in walking, not elsewhere classified: Secondary | ICD-10-CM | POA: Diagnosis not present

## 2023-05-19 DIAGNOSIS — M25562 Pain in left knee: Secondary | ICD-10-CM | POA: Diagnosis not present

## 2023-05-19 DIAGNOSIS — M170 Bilateral primary osteoarthritis of knee: Secondary | ICD-10-CM | POA: Diagnosis not present

## 2023-05-26 DIAGNOSIS — M25561 Pain in right knee: Secondary | ICD-10-CM | POA: Diagnosis not present

## 2023-05-26 DIAGNOSIS — M170 Bilateral primary osteoarthritis of knee: Secondary | ICD-10-CM | POA: Diagnosis not present

## 2023-05-26 DIAGNOSIS — R262 Difficulty in walking, not elsewhere classified: Secondary | ICD-10-CM | POA: Diagnosis not present

## 2023-05-26 DIAGNOSIS — M25562 Pain in left knee: Secondary | ICD-10-CM | POA: Diagnosis not present

## 2023-06-02 DIAGNOSIS — M25562 Pain in left knee: Secondary | ICD-10-CM | POA: Diagnosis not present

## 2023-06-02 DIAGNOSIS — M25561 Pain in right knee: Secondary | ICD-10-CM | POA: Diagnosis not present

## 2023-06-02 DIAGNOSIS — M170 Bilateral primary osteoarthritis of knee: Secondary | ICD-10-CM | POA: Diagnosis not present

## 2023-06-02 DIAGNOSIS — R262 Difficulty in walking, not elsewhere classified: Secondary | ICD-10-CM | POA: Diagnosis not present

## 2023-06-03 DIAGNOSIS — Z1231 Encounter for screening mammogram for malignant neoplasm of breast: Secondary | ICD-10-CM | POA: Diagnosis not present

## 2023-06-03 LAB — HM MAMMOGRAPHY

## 2023-06-04 ENCOUNTER — Encounter: Payer: Self-pay | Admitting: Family Medicine

## 2023-06-09 DIAGNOSIS — M25561 Pain in right knee: Secondary | ICD-10-CM | POA: Diagnosis not present

## 2023-06-09 DIAGNOSIS — R262 Difficulty in walking, not elsewhere classified: Secondary | ICD-10-CM | POA: Diagnosis not present

## 2023-06-09 DIAGNOSIS — M170 Bilateral primary osteoarthritis of knee: Secondary | ICD-10-CM | POA: Diagnosis not present

## 2023-06-09 DIAGNOSIS — M25562 Pain in left knee: Secondary | ICD-10-CM | POA: Diagnosis not present

## 2023-06-15 DIAGNOSIS — L720 Epidermal cyst: Secondary | ICD-10-CM | POA: Diagnosis not present

## 2023-06-15 DIAGNOSIS — L728 Other follicular cysts of the skin and subcutaneous tissue: Secondary | ICD-10-CM | POA: Diagnosis not present

## 2023-06-16 DIAGNOSIS — M25561 Pain in right knee: Secondary | ICD-10-CM | POA: Diagnosis not present

## 2023-06-16 DIAGNOSIS — M25562 Pain in left knee: Secondary | ICD-10-CM | POA: Diagnosis not present

## 2023-06-16 DIAGNOSIS — M170 Bilateral primary osteoarthritis of knee: Secondary | ICD-10-CM | POA: Diagnosis not present

## 2023-06-16 DIAGNOSIS — R262 Difficulty in walking, not elsewhere classified: Secondary | ICD-10-CM | POA: Diagnosis not present

## 2023-07-04 ENCOUNTER — Ambulatory Visit
Admission: EM | Admit: 2023-07-04 | Discharge: 2023-07-04 | Disposition: A | Discharge status: Home / Self Care | Payer: Medicare Other | Attending: Internal Medicine | Admitting: Internal Medicine

## 2023-07-04 DIAGNOSIS — B029 Zoster without complications: Secondary | ICD-10-CM

## 2023-07-04 MED ORDER — VALACYCLOVIR HCL 1 G PO TABS: 1000.0000 mg | ORAL_TABLET | Freq: Three times a day (TID) | ORAL | 0 refills | Status: DC

## 2023-07-04 MED ORDER — GABAPENTIN 100 MG PO CAPS: 100.0000 mg | ORAL_CAPSULE | Freq: Three times a day (TID) | ORAL | 0 refills | Status: DC

## 2023-07-04 NOTE — Discharge Instructions (Addendum)
Start valacyclovir to help with your shingles rash. Use Tylenol and/or ibuprofen for pain relief. You can use gabapentin for nerve pain if you end up needing it.

## 2023-07-04 NOTE — ED Triage Notes (Signed)
Pt reports she started having a bump in the face x 3 days, started spreading to the right eye since lats night. Pt reports looks like impetigo. Pt has not use any meds fro complaint.

## 2023-07-04 NOTE — ED Provider Notes (Signed)
[facility]  Note:  This document was prepared using Conservation officer, historic buildings and may include unintentional dictation errors.  MRN: [id number] DOB: [birth year]  Subjective:   Susan Beard is a 72 y.o. female presenting for 3-day history of bumps on the face over the right side.  Initially patient reported pain over the mid forehead.  Then the rash developed.  She has been undergoing a lot of stress as her husband has been in the hospital for the past 3 weeks.  She did get her Shingrix vaccine.  No current facility-administered medications for this encounter.  Current Outpatient Medications:    alendronate (FOSAMAX) 70 MG tablet, Take 70 mg by mouth once a week., Disp: , Rfl:    diphenhydrAMINE (BENADRYL) 50 MG tablet, Take 50 mg by mouth at bedtime as needed for itching., Disp: , Rfl:    Multiple Vitamin (MULITIVITAMIN WITH MINERALS) TABS, Take 1 tablet by mouth daily., Disp: , Rfl:    rosuvastatin (CRESTOR) 10 MG tablet, Take 1 tablet (10 mg total) by mouth daily., Disp: 90 tablet, Rfl: 3   Allergies  Allergen Reactions   Codeine Palpitations    Past Medical History:  Diagnosis Date   Age-related osteoporosis without current pathological fracture    Dexa Scan July 2017   Arthritis    Breast cancer (HCC) 1989   right   Heart murmur    Primary osteoarthritis of knees, bilateral 03/13/2019     Past Surgical History:  Procedure Laterality Date   BREAST RECONSTRUCTION Right    MASTECTOMY MODIFIED RADICAL Right 1989    Family History  Problem Relation Age of Onset   Arthritis Mother    Hyperlipidemia Mother    Osteoporosis Mother    Polymyalgia rheumatica Mother    Brain cancer Father    Hyperlipidemia Father    Healthy Sister    Diabetes Daughter    Healthy Son    Colon cancer Neg Hx    Colon polyps Neg Hx    Esophageal cancer Neg Hx    Rectal cancer Neg Hx    Stomach cancer Neg Hx     Social History   Tobacco Use   Smoking  status: Never   Smokeless tobacco: Never  Vaping Use   Vaping status: Never Used  Substance Use Topics   Alcohol use: Not Currently    Comment: occ   Drug use: No    ROS   Objective:   Vitals: BP (!) 144/85 (BP Location: Right Arm)   Pulse 90   Temp 98.7 F (37.1 C) (Oral)   Resp 18   SpO2 95%   Physical Exam Constitutional:      General: She is not in acute distress.    Appearance: Normal appearance. She is well-developed. She is not ill-appearing, toxic-appearing or diaphoretic.  HENT:     Head: Normocephalic and atraumatic.      Nose: Nose normal.     Mouth/Throat:     Mouth: Mucous membranes are moist.  Eyes:     General: Lids are normal. Lids are everted, no foreign bodies appreciated. Vision grossly intact. No scleral icterus.       Right eye: No foreign body, discharge or hordeolum.        Left eye: No foreign body, discharge or hordeolum.     Extraocular Movements: Extraocular movements intact.     Right eye: Normal extraocular motion.     Left eye: Normal extraocular motion and no nystagmus.  Conjunctiva/sclera:     Right eye: Right conjunctiva is not injected. No chemosis, exudate or hemorrhage.    Left eye: Left conjunctiva is not injected. No chemosis, exudate or hemorrhage. Cardiovascular:     Rate and Rhythm: Normal rate.  Pulmonary:     Effort: Pulmonary effort is normal.  Skin:    General: Skin is warm and dry.  Neurological:     General: No focal deficit present.     Mental Status: She is alert and oriented to person, place, and time.  Psychiatric:        Mood and Affect: Mood normal.        Behavior: Behavior normal.     Assessment and Plan :   PDMP not reviewed this encounter.  1. Herpes zoster without complication    Rashes consistent with shingles.  Recommended valacyclovir.  Patient to use Tylenol and ibuprofen for pain relief.  Offered her gabapentin in the event she would like to use something for neuropathic pain.  No signs  of herpes zoster ophthalmicus.  Counseled patient on potential for adverse effects with medications prescribed/recommended today, ER and return-to-clinic precautions discussed, patient verbalized understanding.    Wallis Bamberg, [place] 07/04/23 [phone]

## 2023-11-10 DIAGNOSIS — M1711 Unilateral primary osteoarthritis, right knee: Secondary | ICD-10-CM | POA: Diagnosis not present

## 2023-11-10 DIAGNOSIS — M170 Bilateral primary osteoarthritis of knee: Secondary | ICD-10-CM | POA: Diagnosis not present

## 2023-11-10 DIAGNOSIS — M1712 Unilateral primary osteoarthritis, left knee: Secondary | ICD-10-CM | POA: Diagnosis not present

## 2023-11-22 ENCOUNTER — Ambulatory Visit (INDEPENDENT_AMBULATORY_CARE_PROVIDER_SITE_OTHER): Payer: Medicare Other

## 2023-11-22 ENCOUNTER — Encounter: Payer: Self-pay | Admitting: Family Medicine

## 2023-11-22 ENCOUNTER — Other Ambulatory Visit: Payer: Self-pay | Admitting: Family Medicine

## 2023-11-22 VITALS — Wt 210.0 lb

## 2023-11-22 DIAGNOSIS — Z0000 Encounter for general adult medical examination without abnormal findings: Secondary | ICD-10-CM

## 2023-11-22 NOTE — Patient Instructions (Signed)
 Susan Beard , Thank you for taking time to come for your Medicare Wellness Visit. I appreciate your ongoing commitment to your health goals. Please review the following plan we discussed and let me know if I can assist you in the future.   Referrals/Orders/Follow-Ups/Clinician Recommendations: Aim for 30 minutes of exercise or brisk walking, 6-8 glasses of water, and 5 servings of fruits and vegetables each day. Start to work on losing weight regimens   This is a list of the screening recommended for you and due dates:  Health Maintenance  Topic Date Due   COVID-19 Vaccine (3 - 2024-25 season) 06/06/2023   Medicare Annual Wellness Visit  11/06/2023   Flu Shot  01/03/2024*   DEXA scan (bone density measurement)  05/26/2024   Mammogram  06/02/2024   Colon Cancer Screening  11/12/2029   Pneumonia Vaccine  Completed   Hepatitis C Screening  Completed   Zoster (Shingles) Vaccine  Completed   HPV Vaccine  Aged Out   DTaP/Tdap/Td vaccine  Discontinued   Cologuard (Stool DNA test)  Discontinued  *Topic was postponed. The date shown is not the original due date.    Advanced directives: (Copy Requested) Please bring a copy of your health care power of attorney and living will to the office to be added to your chart at your convenience.  Next Medicare Annual Wellness Visit scheduled for next year: Yes

## 2023-11-22 NOTE — Progress Notes (Signed)
 Subjective:   Susan Beard is a 72 y.o. female who presents for Medicare Annual (Subsequent) preventive examination.  Visit Complete: Virtual I connected with  Susan Beard on 11/22/23 by a audio enabled telemedicine application and verified that I am speaking with the correct person using two identifiers.  Patient Location: Home  Provider Location: Office/Clinic  I discussed the limitations of evaluation and management by telemedicine. The patient expressed understanding and agreed to proceed.  Vital Signs: Because this visit was a virtual/telehealth visit, some criteria may be missing or patient reported. Any vitals not documented were not able to be obtained and vitals that have been documented are patient reported.  Patient Medicare AWV questionnaire was completed by the patient on 11/22/23; I have confirmed that all information answered by patient is correct and no changes since this date.  Cardiac Risk Factors include: advanced age (>55men, >69 women);dyslipidemia;obesity (BMI >30kg/m2)     Objective:    Today's Vitals   11/22/23 0805  Weight: 210 lb (95.3 kg)   Body mass index is 31.93 kg/m.     11/22/2023    8:11 AM 11/05/2022    8:10 AM 10/30/2021    8:07 AM 10/24/2020    8:07 AM 10/03/2019    2:36 PM 09/02/2016    2:49 PM  Advanced Directives  Does Patient Have a Medical Advance Directive? Yes Yes Yes Yes Yes No  Type of Estate agent of [place];Living will Healthcare Power of [place];Living will Healthcare Power of [employer] of [place];Living will Living will;Healthcare Power of Attorney   Does patient want to make changes to medical advance directive?     No - Patient declined   Copy of Healthcare Power of Attorney in Chart? No - copy requested No - copy requested No - copy requested No - copy requested No - copy requested   Would patient like information on creating a medical advance directive?      No - Patient declined     Current Medications (verified) Outpatient Encounter Medications as of 11/22/2023  Medication Sig   alendronate (FOSAMAX) 70 MG tablet Take 70 mg by mouth once a week.   diphenhydrAMINE (BENADRYL) 50 MG tablet Take 50 mg by mouth at bedtime as needed for itching.   Multiple Vitamin (MULITIVITAMIN WITH MINERALS) TABS Take 1 tablet by mouth daily.   rosuvastatin (CRESTOR) 10 MG tablet Take 1 tablet (10 mg total) by mouth daily.   [DISCONTINUED] gabapentin (NEURONTIN) 100 MG capsule Take 1 capsule (100 mg total) by mouth 3 (three) times daily.   [DISCONTINUED] valACYclovir (VALTREX) 1000 MG tablet Take 1 tablet (1,000 mg total) by mouth 3 (three) times daily.   No facility-administered encounter medications on file as of 11/22/2023.    Allergies (verified) Codeine   History: Past Medical History:  Diagnosis Date   Age-related osteoporosis without current pathological fracture    Dexa Scan July 2017   Arthritis    Breast cancer (HCC) 1989   right   Heart murmur    Primary osteoarthritis of knees, bilateral 03/13/2019   Past Surgical History:  Procedure Laterality Date   BREAST RECONSTRUCTION Right    MASTECTOMY MODIFIED RADICAL Right 1989   Family History  Problem Relation Age of Onset   Arthritis Mother    Hyperlipidemia Mother    Osteoporosis Mother    Polymyalgia rheumatica Mother    Brain cancer Father    Hyperlipidemia Father    Healthy Sister    Diabetes  Daughter    Healthy Son    Colon cancer Neg Hx    Colon polyps Neg Hx    Esophageal cancer Neg Hx    Rectal cancer Neg Hx    Stomach cancer Neg Hx    Social History   Socioeconomic History   Marital status: Married    Spouse name: Not on file   Number of children: 2   Years of education: Not on file   Highest education level: Not on file  Occupational History   Occupation: Licensed Landscape architect: UNKNOWN  Tobacco Use   Smoking status: Never   Smokeless tobacco: Never  Vaping Use    Vaping status: Never Used  Substance and Sexual Activity   Alcohol use: Not Currently    Comment: occ   Drug use: No   Sexual activity: Yes    Birth control/protection: Post-menopausal  Other Topics Concern   Not on file  Social History Narrative   14 steps in home    Social Drivers of Health   Financial Resource Strain: Low Risk  (11/22/2023)   Overall Financial Resource Strain (CARDIA)    Difficulty of Paying Living Expenses: Not hard at all  Food Insecurity: No Food Insecurity (11/22/2023)   Hunger Vital Sign    Worried About Running Out of Food in the Last Year: Never true    Ran Out of Food in the Last Year: Never true  Transportation Needs: No Transportation Needs (11/22/2023)   PRAPARE - Administrator, Civil Service (Medical): No    Lack of Transportation (Non-Medical): No  Physical Activity: Inactive (11/22/2023)   Exercise Vital Sign    Days of Exercise per Week: 0 days    Minutes of Exercise per Session: 0 min  Stress: No Stress Concern Present (11/22/2023)   [employer] of Occupational Health - Occupational Stress Questionnaire    Feeling of Stress : Not at all  Social Connections: Moderately Integrated (11/22/2023)   Social Connection and Isolation Panel [NHANES]    Frequency of Communication with Friends and Family: More than three times a week    Frequency of Social Gatherings with Friends and Family: More than three times a week    Attends Religious Services: More than 4 times per year    Active Member of [employer] or Organizations: No    Attends Engineer, structural: Never    Marital Status: Married    Tobacco Counseling Counseling given: Not Answered   Clinical Intake:  Pre-visit preparation completed: Yes  Pain : No/denies pain     BMI - recorded: 31.93 Nutritional Status: BMI > 30  Obese Diabetes: No  How often do you need to have someone help you when you read instructions, pamphlets, or other written materials from your  doctor or pharmacy?: 1 - Never  Interpreter Needed?: No  Information entered by :: Lanier Ensign, LPN   Activities of Daily Living    11/22/2023    8:10 AM 11/22/2023    8:07 AM  In your present state of health, do you have any difficulty performing the following activities:  Hearing?  0  Vision?  0  Difficulty concentrating or making decisions? 0 0  Walking or climbing stairs? 0 0  Dressing or bathing? 0 0  Doing errands, shopping? 0 0  Preparing Food and eating ? N N  Using the Toilet?  N  In the past six months, have you accidently leaked urine? N N  Do  you have problems with loss of bowel control? N N  Managing your Medications? N N  Managing your Finances? N N  Housekeeping or managing your Housekeeping? N N    Patient Care Team: Willow Ora, MD as PCP - General (Family Medicine) Lavada Mesi, MD as Consulting Physician (Sports Medicine) Eldred Manges, MD as Consulting Physician (Orthopedic Surgery)  Indicate any recent Medical Services you may have received from other than [facility] providers in the past year (date may be approximate).     Assessment:   This is a routine wellness examination for Susan Beard.  Hearing/Vision screen Hearing Screening - Comments:: Pt denies any hearing issues  Vision Screening - Comments:: Pt follows up with Dr  Herbert Moors for annual eye exams    Goals Addressed             This Visit's Progress    Patient Stated       Lose weight        Depression Screen    11/22/2023    8:09 AM 04/01/2023    8:43 AM 04/01/2023    8:38 AM 11/05/2022    8:09 AM 03/30/2022    8:51 AM 10/30/2021    8:06 AM 10/24/2020    8:05 AM  PHQ 2/9 Scores  PHQ - 2 Score 0 0 0 0 0 0 0    Fall Risk    11/22/2023    8:10 AM 04/01/2023    8:43 AM 04/01/2023    8:38 AM 11/05/2022    8:10 AM 03/30/2022    8:51 AM  Fall Risk   Falls in the past year? 0 0 0 0 0  Number falls in past yr: 0 0 0 0 0  Injury with Fall? 0 0 0 0 0  Risk for fall due to : No Fall  Risks No Fall Risks No Fall Risks Impaired vision No Fall Risks  Follow up Falls prevention discussed Falls evaluation completed Falls evaluation completed Falls prevention discussed Falls evaluation completed    MEDICARE RISK AT HOME: Medicare Risk at Home Any stairs in or around the home?: (Patient-Rptd) Yes If so, are there any without handrails?: (Patient-Rptd) No Home free of loose throw rugs in walkways, pet beds, electrical cords, etc?: (Patient-Rptd) Yes Adequate lighting in your home to reduce risk of falls?: (Patient-Rptd) Yes Life alert?: (Patient-Rptd) No Use of a cane, walker or w/c?: (Patient-Rptd) No Grab bars in the bathroom?: (Patient-Rptd) Yes Shower chair or bench in shower?: (Patient-Rptd) Yes Elevated toilet seat or a handicapped toilet?: (Patient-Rptd) Yes  TIMED UP AND GO:  Was the test performed?  No    Cognitive Function:        11/22/2023    8:12 AM 11/05/2022    8:11 AM 10/30/2021    8:10 AM 10/24/2020    8:10 AM  6CIT Screen  What Year? 0 points 0 points 0 points 0 points  What month? 0 points 0 points 0 points 0 points  What time? 0 points 0 points 0 points   Count back from 20 0 points 0 points 0 points 0 points  Months in reverse 0 points 0 points 0 points 0 points  Repeat phrase 0 points 0 points 0 points 0 points  Total Score 0 points 0 points 0 points     Immunizations Immunization History  Administered Date(s) Administered   Fluad Quad(high Dose 65+) 06/11/2020   Influenza, High Dose Seasonal PF 06/16/2018, 06/23/2019   PFIZER(Purple Top)SARS-COV-2 Vaccination 12/10/2019,  01/10/2020   Pneumococcal Conjugate-13 10/16/2015   Pneumococcal Polysaccharide-23 12/18/2016   Tdap 09/30/2008   Zoster Recombinant(Shingrix) 06/16/2018, 11/11/2018   Zoster, Live 09/30/2005      Flu Vaccine status: Declined, Education has been provided regarding the importance of this vaccine but patient still declined. Advised may receive this vaccine at local  pharmacy or Health Dept. Aware to provide a copy of the vaccination record if obtained from local pharmacy or Health Dept. Verbalized acceptance and understanding.  Pneumococcal vaccine status: Up to date  Covid-19 vaccine status: Information provided on how to obtain vaccines.   Qualifies for Shingles Vaccine? Yes   Zostavax completed Yes   Shingrix Completed?: Yes  Screening Tests Health Maintenance  Topic Date Due   COVID-19 Vaccine (3 - 2024-25 season) 06/06/2023   INFLUENZA VACCINE  01/03/2024 (Originally 05/06/2023)   DEXA SCAN  05/26/2024   MAMMOGRAM  06/02/2024   Medicare Annual Wellness (AWV)  11/21/2024   Colonoscopy  11/12/2029   Pneumonia Vaccine 41+ Years old  Completed   Hepatitis C Screening  Completed   Zoster Vaccines- Shingrix  Completed   HPV VACCINES  Aged Out   DTaP/Tdap/Td  Discontinued   Fecal DNA (Cologuard)  Discontinued    Health Maintenance  Health Maintenance Due  Topic Date Due   COVID-19 Vaccine (3 - 2024-25 season) 06/06/2023    Colorectal cancer screening: Type of screening: Colonoscopy. Completed 11/12/22. Repeat every 7 years  Mammogram status: Completed 06/03/23. Repeat every year  Bone Density status: Completed 05/26/24. Results reflect: Bone density results: OSTEOPOROSIS. Repeat every 2 years.   Additional Screening:  Hepatitis C Screening:  Completed 04/01/23  Vision Screening: Recommended annual ophthalmology exams for early detection of glaucoma and other disorders of the eye. Is the patient up to date with their annual eye exam?  Yes  Who is the provider or what is the name of the office in which the patient attends annual eye exams? Dr Herbert Moors  If pt is not established with a provider, would they like to be referred to a provider to establish care? No .   Dental Screening: Recommended annual dental exams for proper oral hygiene   Community Resource Referral / Chronic Care Management: CRR required this visit?  No   CCM  required this visit?  No     Plan:     I have personally reviewed and noted the following in the patient's chart:   Medical and social history Use of alcohol, tobacco or illicit drugs  Current medications and supplements including opioid prescriptions. Patient is not currently taking opioid prescriptions. Functional ability and status Nutritional status Physical activity Advanced directives List of other physicians Hospitalizations, surgeries, and ER visits in previous 12 months Vitals Screenings to include cognitive, depression, and falls Referrals and appointments  In addition, I have reviewed and discussed with patient certain preventive protocols, quality metrics, and best practice recommendations. A written personalized care plan for preventive services as well as general preventive health recommendations were provided to patient.     Marzella Schlein, LPN   [id number]/61/0960   After Visit Summary: (MyChart) Due to this being a telephonic visit, the after visit summary with patients personalized plan was offered to patient via MyChart   Nurse Notes: Pt stated she will send a my chart message to discuss GLP-1 medications on losing weight.

## 2024-03-26 ENCOUNTER — Other Ambulatory Visit: Payer: Self-pay | Admitting: Family Medicine

## 2024-04-05 ENCOUNTER — Encounter: Payer: Medicare Other | Admitting: Family Medicine

## 2024-05-04 ENCOUNTER — Ambulatory Visit: Admitting: Family Medicine

## 2024-05-24 ENCOUNTER — Encounter: Admitting: Family Medicine

## 2024-06-08 DIAGNOSIS — Z1231 Encounter for screening mammogram for malignant neoplasm of breast: Secondary | ICD-10-CM | POA: Diagnosis not present

## 2024-06-08 LAB — HM MAMMOGRAPHY

## 2024-06-12 ENCOUNTER — Encounter: Payer: Self-pay | Admitting: Family Medicine

## 2024-10-30 ENCOUNTER — Other Ambulatory Visit: Payer: Self-pay | Admitting: Family Medicine

## 2024-11-23 ENCOUNTER — Ambulatory Visit: Payer: Medicare Other
